# Patient Record
Sex: Female | Born: 1980 | Race: White | Hispanic: No | Marital: Married | Smoking: Current some day smoker
Health system: Southern US, Community
[De-identification: ages and names within clinical notes are randomized; demographics above are authoritative.]

## PROBLEM LIST (undated history)

## (undated) DIAGNOSIS — T7840XA Allergy, unspecified, initial encounter: Secondary | ICD-10-CM

## (undated) HISTORY — PX: WISDOM TOOTH EXTRACTION: SHX21

## (undated) HISTORY — DX: Allergy, unspecified, initial encounter: T78.40XA

---

## 2013-10-12 ENCOUNTER — Telehealth: Payer: Self-pay | Admitting: Family Medicine

## 2013-10-12 NOTE — Telephone Encounter (Signed)
This is a normal reaction to a tick bite and the are may not resolve completely for a while. May use hydrocortisone or benadryl cream for itching. Advised her to schedule an appt if she develops a rash or fever or if she develops any increased redness or warmth around the area or drainage. Patient stated understanding and agreement to plan.

## 2014-01-01 ENCOUNTER — Encounter (INDEPENDENT_AMBULATORY_CARE_PROVIDER_SITE_OTHER): Payer: Self-pay

## 2014-01-01 ENCOUNTER — Encounter: Payer: Self-pay | Admitting: Family Medicine

## 2014-01-01 ENCOUNTER — Ambulatory Visit (INDEPENDENT_AMBULATORY_CARE_PROVIDER_SITE_OTHER): Payer: BC Managed Care – PPO | Admitting: Family Medicine

## 2014-01-01 VITALS — BP 108/74 | HR 81 | Temp 99.5°F | Ht 66.5 in | Wt 147.2 lb

## 2014-01-01 DIAGNOSIS — N898 Other specified noninflammatory disorders of vagina: Secondary | ICD-10-CM

## 2014-01-01 DIAGNOSIS — R350 Frequency of micturition: Secondary | ICD-10-CM

## 2014-01-01 LAB — POCT URINALYSIS DIPSTICK
Bilirubin, UA: NEGATIVE
Glucose, UA: NEGATIVE
Ketones, UA: NEGATIVE
Leukocytes, UA: NEGATIVE
Nitrite, UA: NEGATIVE
Spec Grav, UA: >=1.03
Urobilinogen, UA: 0.2
pH, UA: 6

## 2014-01-01 LAB — POCT UA - MICROSCOPIC ONLY
Bacteria, U Microscopic: NEGATIVE
Casts, Ur, LPF, POC: NEGATIVE
Crystals, Ur, HPF, POC: NEGATIVE
Mucus, UA: NEGATIVE
WBC, Ur, HPF, POC: NEGATIVE
Yeast, UA: NEGATIVE

## 2014-01-01 MED ORDER — METRONIDAZOLE 500 MG PO TABS: 500.0000 mg | ORAL_TABLET | Freq: Three times a day (TID) | ORAL | Status: DC

## 2014-01-01 MED ORDER — CIPROFLOXACIN HCL 500 MG PO TABS: 500.0000 mg | ORAL_TABLET | Freq: Two times a day (BID) | ORAL | Status: DC

## 2014-01-01 NOTE — Progress Notes (Signed)
   Subjective:    Patient ID: Haley Allen, female    DOB: Nov 05, 1980, 33 y.o.   MRN: [id number]  HPI This 33 y.o. female presents for evaluation of vaginal discharge and dysuria.  She is requesting  STD testing.   Review of Systems C/o dysuria and vaginal discharge No chest pain, SOB, HA, dizziness, vision change, N/V, diarrhea, constipation, dysuria, urinary urgency or frequency, myalgias, arthralgias or rash.     Objective:   Physical Exam   Vital signs noted  Well developed well nourished female.  HEENT - Head atraumatic Normocephalic                Eyes - PERRLA, Conjuctiva - clear Sclera- Clear EOMI                Ears - EAC's Wnl TM's Wnl Gross Hearing WNL                Nose - Nares patent                 Throat - oropharanx wnl Respiratory - Lungs CTA bilateral Cardiac - RRR S1 and S2 without murmur GI - Abdomen soft Nontender and bowel sounds active x 4 Extremities - No edema. Neuro - Grossly intact.     Assessment & Plan:  Urinary frequency - Plan: POCT UA - Microscopic Only, POCT urinalysis dipstick, GC/Chlamydia Probe Amp, metroNIDAZOLE (FLAGYL) 500 MG tablet, ciprofloxacin (CIPRO) 500 MG tablet, HIV antibody (with reflex)  Vaginal discharge - Plan: GC/Chlamydia Probe Amp, metroNIDAZOLE (FLAGYL) 500 MG tablet, ciprofloxacin (CIPRO) 500 MG tablet, HIV antibody (with reflex)  Deatra CanterWilliam J Ara Mano FNP

## 2014-01-02 LAB — HIV ANTIBODY (ROUTINE TESTING W REFLEX)
HIV 1/O/2 Abs-Index Value: <1 (ref <1.00)
HIV-1/HIV-2 Ab: NONREACTIVE

## 2014-01-04 LAB — GC/CHLAMYDIA PROBE AMP
Chlamydia trachomatis, NAA: NEGATIVE
Neisseria gonorrhoeae by PCR: NEGATIVE

## 2014-03-29 ENCOUNTER — Ambulatory Visit (INDEPENDENT_AMBULATORY_CARE_PROVIDER_SITE_OTHER): Payer: BC Managed Care – PPO

## 2014-03-29 DIAGNOSIS — Z23 Encounter for immunization: Secondary | ICD-10-CM

## 2016-07-13 ENCOUNTER — Other Ambulatory Visit: Payer: Self-pay | Admitting: Family Medicine

## 2016-07-13 ENCOUNTER — Other Ambulatory Visit: Payer: Self-pay | Admitting: *Deleted

## 2016-07-13 MED ORDER — OSELTAMIVIR PHOSPHATE 75 MG PO CAPS: 75.0000 mg | ORAL_CAPSULE | Freq: Every day | ORAL | 0 refills | Status: DC

## 2017-06-29 NOTE — Progress Notes (Signed)
Subjective: TG:YBWLSLHTD care, URI HPI: Haley Allen is a 37 y.o. female presenting to clinic today for:  1. Cold symptoms  Patient reports sinus pressure, drainage, nasal stuffiness, sore throat that started about 2 weeks ago.  Denies cough, hemoptysis, headache, SOB, dizziness, rash, nausea, vomiting, diarrhea, fevers, chills, myalgia, sick contacts, recent travel.  Patient has used Zyrtec and Sudafed with some relief of symptoms.  Denies history of COPD or asthma.  Currently smokes tobacco.  2.  Tobacco use disorder Patient reports that she is smoked since age 18.  She is currently smoking less than half a pack per day.  She is in the action phase of quitting.  She has a goal date of July 16, 2017, she states that this is her deceased father's birthday and would like to give the gift of being smoke-free and is on her.  She denies cough, hemoptysis, shortness of breath.  3. Preventative care Patient notes that she gets her well woman exams performed with Pap at a women's clinic in [place], [place] Dr Roberto Scales.  She has an IUD in place.  She does not have menstrual cycles.  She is sexually active.  No vaginal concerns.  Denies history of abnormal Pap smears in the past.  She is up-to-date on her influenza shot.  She is unsure of tetanus status.  She has not had basic labs performed in some time.   Past Medical History:  Diagnosis Date  . Allergy    History reviewed. No pertinent surgical history. Social History   Socioeconomic History  . Marital status: Divorced    Spouse name: Not on file  . Number of children: Not on file  . Years of education: Not on file  . Highest education level: Not on file  Social Needs  . Financial resource strain: Not on file  . Food insecurity - worry: Not on file  . Food insecurity - inability: Not on file  . Transportation needs - medical: Not on file  . Transportation needs - non-medical: Not on file  Occupational History  . Not on file  Tobacco Use    . Smoking status: Current Some Day Smoker    Packs/day: 0.50    Years: 10.00    Pack years: 5.00    Types: Cigarettes    Start date: 01/01/1998  . Smokeless tobacco: Current User  Substance and Sexual Activity  . Alcohol use: Yes    Alcohol/week: 3.6 oz    Types: 6 Cans of beer per week  . Drug use: No  . Sexual activity: Yes    Birth control/protection: IUD  Other Topics Concern  . Not on file  Social History Narrative  . Not on file   No outpatient medications have been marked as taking for the 06/30/17 encounter (Office Visit) with Haley Norlander, DO.   Family History  Problem Relation Age of Onset  . Hypertension Mother   . Diabetes Mother   . Depression Mother   . Cancer Father 8       renal  . Renal cancer Father   . Diabetes Maternal Grandmother   . Leukemia Paternal Grandfather   . Prostate cancer Neg Hx   . Colon cancer Neg Hx    No Known Allergies   Health Maintenance: had flu shot.    ROS: Per HPI  Objective: Office vital signs reviewed. BP 118/85   Pulse 80   Temp 98.2 F (36.8 C) (Oral)   Ht _0  (1.702 m)  Wt 151 lb (68.5 kg)   BMI 23.65 kg/m   Physical Examination:  General: Awake, alert, well nourished, No acute distress HEENT: +maxillary sinus TTP    Neck: No masses palpated. No lymphadenopathy    Ears: Tympanic membranes intact, normal light reflex, no erythema, no bulging    Eyes: PERRLA, extraocular movement in tact, sclera white    Nose: nasal turbinates moist, clear nasal discharge    Throat: moist mucus membranes, moderate oropharyngeal erythema, grade 2 tonsils with no tonsillar exudate.  Airway is patent Cardio: regular rate and rhythm, S1S2 heard, no murmurs appreciated Pulm: clear to auscultation bilaterally, no wheezes, rhonchi or rales; normal work of breathing on room air MSK: Normal gait and station Psych: Mood stable, speech normal, affect appropriate, pleasant  Assessment/ Plan: 37 y.o. female   1. Acute  non-recurrent maxillary sinusitis Given duration of symptoms, will treat empirically for a bacterial sinusitis.  Augmentin 875 p.o. twice daily for 10 days prescribed.  Start Flonase.  Continue Zyrtec.  May discontinue Sudafed, particularly with reports of insomnia.  Home care instructions were reviewed and a handout was provided to the patient.  Return as needed.  2. Establishing care with new doctor, encounter for We will attempt to obtain Pap smear records from OB/GYN in [place].  3. Sore throat Rapid strep was negative. - Rapid Strep Screen (Not at [facility])  4. Current every day smoker Currently in the action phase of smoking cessation.  5. Screening for HIV without presence of risk factors - HIV antibody (with reflex)  6. Screening for metabolic disorder - [id number]  7. Screening for lipid disorders - Lipid Panel  Meds ordered this encounter  Medications  . amoxicillin-clavulanate (AUGMENTIN) 875-125 MG tablet    Sig: Take 1 tablet by mouth 2 (two) times daily.    Dispense:  20 tablet    Refill:  0  . fluticasone (FLONASE) 50 MCG/ACT nasal spray    Sig: Place 2 sprays into both nostrils daily.    Dispense:  16 g    Refill:  [place], [place] [phone]

## 2017-06-30 ENCOUNTER — Encounter: Payer: Self-pay | Admitting: Family Medicine

## 2017-06-30 ENCOUNTER — Ambulatory Visit: Payer: BLUE CROSS/BLUE SHIELD | Admitting: Family Medicine

## 2017-06-30 VITALS — BP 118/85 | HR 80 | Temp 98.2°F | Ht 67.0 in | Wt 151.0 lb

## 2017-06-30 DIAGNOSIS — Z7689 Persons encountering health services in other specified circumstances: Secondary | ICD-10-CM | POA: Diagnosis not present

## 2017-06-30 DIAGNOSIS — J029 Acute pharyngitis, unspecified: Secondary | ICD-10-CM | POA: Diagnosis not present

## 2017-06-30 DIAGNOSIS — Z114 Encounter for screening for human immunodeficiency virus [HIV]: Secondary | ICD-10-CM

## 2017-06-30 DIAGNOSIS — Z13220 Encounter for screening for lipoid disorders: Secondary | ICD-10-CM

## 2017-06-30 DIAGNOSIS — Z87891 Personal history of nicotine dependence: Secondary | ICD-10-CM | POA: Insufficient documentation

## 2017-06-30 DIAGNOSIS — F17200 Nicotine dependence, unspecified, uncomplicated: Secondary | ICD-10-CM | POA: Insufficient documentation

## 2017-06-30 DIAGNOSIS — J0100 Acute maxillary sinusitis, unspecified: Secondary | ICD-10-CM

## 2017-06-30 DIAGNOSIS — Z13228 Encounter for screening for other metabolic disorders: Secondary | ICD-10-CM

## 2017-06-30 LAB — RAPID STREP SCREEN (MED CTR MEBANE ONLY): Strep Gp A Ag, IA W/Reflex: NEGATIVE

## 2017-06-30 LAB — CULTURE, GROUP A STREP

## 2017-06-30 MED ORDER — AMOXICILLIN-POT CLAVULANATE 875-125 MG PO TABS
1.0000 | ORAL_TABLET | Freq: Two times a day (BID) | ORAL | 0 refills | Status: DC
Start: 2017-06-30 — End: 2018-12-12

## 2017-06-30 MED ORDER — FLUTICASONE PROPIONATE 50 MCG/ACT NA SUSP: 2.0000 | Freq: Every day | NASAL | 6 refills | Status: DC

## 2017-06-30 NOTE — Assessment & Plan Note (Addendum)
She has a ~15 pack year history.  Action phase of smoking cessation.  Goal date for discontinuation is 07/16/2017.  We will continue to follow and support patient as able.

## 2017-06-30 NOTE — Patient Instructions (Addendum)
You had labs performed today.  You will be contacted with the results of the labs once they are available, usually in the next 3 days for routine lab work.  Follow up as needed.  I value your feedback and appreciate you entrusting [place] with your care.  If you get a survey, I would appreciate your taking the time to let us know what your experience was like.  - Get plenty of rest and drink plenty of fluids. - Try to breathe moist air. Use a cold mist humidifier. - Consume warm fluids (soup or tea) to provide relief for a stuffy nose and to loosen phlegm. - For nasal stuffiness, try saline nasal spray or a Neti Pot. Afrin nasal spray can also be used but this product should not be used longer than 3 days or it will cause rebound nasal stuffiness (worsening nasal congestion). - For sore throat pain relief: suck on throat lozenges, hard candy or popsicles; gargle with warm salt water (1/4 tsp. salt per 8 oz. of water); and eat soft, bland foods. - Eat a well-balanced diet. If you cannot, ensure you are getting enough nutrients by taking a daily multivitamin. - Avoid dairy products, as they can thicken phlegm. - Avoid alcohol, as it impairs your body's immune system.  CONTACT YOUR DOCTOR IF YOU EXPERIENCE ANY OF THE FOLLOWING: - High fever - Ear pain - Sinus-type headache - Unusually severe cold symptoms - Cough that gets worse while other cold symptoms improve - Flare up of any chronic lung problem, such as asthma - Your symptoms persist longer than 2 weeks   Tobacco Use Disorder Tobacco use disorder (TUD) is a mental disorder. It is the long-term use of tobacco in spite of related health problems or difficulty with normal life activities. Tobacco is most commonly smoked as cigarettes and less commonly as cigars or pipes. Smokeless chewing tobacco and snuff are also popular. People with TUD get a feeling of extreme pleasure (euphoria) from using tobacco and have a desire to use it again and  again. Repeated use of tobacco can cause problems. The addictive effects of tobacco are due mainly tothe ingredient nicotine. Nicotine also causes a rush of adrenaline (epinephrine) in the body. This leads to increased blood pressure, heart rate, and breathing rate. These changes may cause problems for people with high blood pressure, weak hearts, or lung disease. High doses of nicotine in children and pets can lead to seizures and death. Tobacco contains a number of other unsafe chemicals. These chemicals are especially harmful when inhaled as smoke and can damage almost every organ in the body. Smokers live shorter lives than nonsmokers and are at risk of dying from a number of diseases and cancers. Tobacco smoke can also cause health problems for nonsmokers (due to inhaling secondhand smoke). Smoking is also a fire hazard. TUD usually starts in the late teenage years and is most common in young adults between the ages of 30 and 25 years. People who start smoking earlier in life are more likely to continue smoking as adults. TUD is somewhat more common in men than women. People with TUD are at higher risk for using alcohol and other drugs of abuse. What increases the risk? Risk factors for TUD include:  Having family members with the disorder.  Being around people who use tobacco.  Having an existing mental health issue such as schizophrenia, depression, bipolar disorder, ADHD, or posttraumatic stress disorder (PTSD).  What are the signs or symptoms? People with  tobacco use disorder have two or more of the following signs and symptoms within 12 months:  Use of more tobacco over a longer period than intended.  Not able to cut down or control tobacco use.  A lot of time spent obtaining or using tobacco.  Strong desire or urge to use tobacco (craving). Cravings may last for 6 months or longer after quitting.  Use of tobacco even when use leads to major problems at work, school, or  home.  Use of tobacco even when use leads to relationship problems.  Giving up or cutting down on important life activities because of tobacco use.  Repeatedly using tobacco in situations where it puts you or others in physical danger, like smoking in bed.  Use of tobacco even when it is known that a physical or mental problem is likely related to tobacco use. ? Physical problems are numerous and may include chronic bronchitis, emphysema, lung and other cancers, gum disease, high blood pressure, heart disease, and stroke. ? Mental problems caused by tobacco may include difficulty sleeping and anxiety.  Need to use greater amounts of tobacco to get the same effect. This means you have developed a tolerance.  Withdrawal symptoms as a result of stopping or rapidly cutting back use. These symptoms may last a month or more after quitting and include the following: ? Depressed, anxious, or irritable mood. ? Difficulty concentrating. ? Increased appetite. ? Restlessness or trouble sleeping. ? Use of tobacco to avoid withdrawal symptoms.  How is this diagnosed? Tobacco use disorder is diagnosed by your health care provider. A diagnosis may be made by:  Your health care provider asking questions about your tobacco use and any problems it may be causing.  A physical exam.  Lab tests.  You may be referred to a mental health professional or addiction specialist.  The severity of tobacco use disorder depends on the number of signs and symptoms you have:  Mild-Two or three symptoms.  Moderate-Four or five symptoms.  Severe-Six or more symptoms.  How is this treated? Many people with tobacco use disorder are unable to quit on their own and need help. Treatment options include the following:  Nicotine replacement therapy (NRT). NRT provides nicotine without the other harmful chemicals in tobacco. NRT gradually lowers the dosage of nicotine in the body and reduces withdrawal symptoms. NRT  is available in over-the-counter forms (gum, lozenges, and skin patches) as well as prescription forms (mouth inhaler and nasal spray).  Medicines.This may include: ? Antidepressant medicine that may reduce nicotine cravings. ? A medicine that acts on nicotine receptors in the brain to reduce cravings and withdrawal symptoms. It may also block the effects of tobacco in people with TUD who relapse.  Counseling or talk therapy. A form of talk therapy called behavioral therapy is commonly used to treat people with TUD. Behavioral therapy looks at triggers for tobacco use, how to avoid them, and how to cope with cravings. It is most effective in person or by phone but is also available in self-help forms (books and Internet websites).  Support groups. These provide emotional support, advice, and guidance for quitting tobacco.  The most effective treatment for TUD is usually a combination of medicine, talk therapy, and support groups. Follow these instructions at home:  Keep all follow-up visits as directed by your health care provider. This is important.  Take medicines only as directed by your health care provider.  Check with your health care provider before starting new prescription or  over-the-counter medicines. Contact a health care provider if:  You are not able to take your medicines as prescribed.  Treatment is not helping your TUD and your symptoms get worse. Get help right away if:  You have serious thoughts about hurting yourself or others.  You have trouble breathing, chest pain, sudden weakness, or sudden numbness in part of your body. This information is not intended to replace advice given to you by your health care provider. Make sure you discuss any questions you have with your health care provider. Document Released: 02/11/2004 Document Revised: 02/08/2016 Document Reviewed: 08/03/2013 Elsevier Interactive Patient Education  [employer].

## 2017-07-01 LAB — CMP14+EGFR
A/G RATIO: 1.8 (ref 1.2–2.2)
ALT: 11 IU/L (ref 0–32)
AST: 17 IU/L (ref 0–40)
Albumin: 4.6 g/dL (ref 3.5–5.5)
Alkaline Phosphatase: 53 IU/L (ref 39–117)
BILIRUBIN TOTAL: 0.4 mg/dL (ref 0.0–1.2)
BUN/Creatinine Ratio: 10 (ref 9–23)
BUN: 8 mg/dL (ref 6–20)
CALCIUM: 9.2 mg/dL (ref 8.7–10.2)
CHLORIDE: 103 mmol/L (ref 96–106)
CO2: 24 mmol/L (ref 20–29)
Creatinine, Ser: 0.77 mg/dL (ref 0.57–1.00)
GFR, EST AFRICAN AMERICAN: 113 mL/min/{1.73_m2} (ref >59)
GFR, EST NON AFRICAN AMERICAN: 98 mL/min/{1.73_m2} (ref >59)
GLOBULIN, TOTAL: 2.5 g/dL (ref 1.5–4.5)
Glucose: 81 mg/dL (ref 65–99)
POTASSIUM: 4.3 mmol/L (ref 3.5–5.2)
Sodium: 142 mmol/L (ref 134–144)
TOTAL PROTEIN: 7.1 g/dL (ref 6.0–8.5)

## 2017-07-01 LAB — LIPID PANEL
CHOL/HDL RATIO: 3.4 ratio (ref 0.0–4.4)
Cholesterol, Total: 182 mg/dL (ref 100–199)
HDL: 53 mg/dL (ref >39)
LDL Calculated: 114 mg/dL — ABNORMAL HIGH (ref 0–99)
Triglycerides: 73 mg/dL (ref 0–149)
VLDL Cholesterol Cal: 15 mg/dL (ref 5–40)

## 2017-07-01 LAB — HIV ANTIBODY (ROUTINE TESTING W REFLEX): HIV Screen 4th Generation wRfx: NONREACTIVE

## 2018-12-07 DIAGNOSIS — H5203 Hypermetropia, bilateral: Secondary | ICD-10-CM | POA: Diagnosis not present

## 2018-12-11 DIAGNOSIS — H5213 Myopia, bilateral: Secondary | ICD-10-CM | POA: Diagnosis not present

## 2018-12-12 ENCOUNTER — Encounter: Payer: Self-pay | Admitting: Nurse Practitioner

## 2018-12-12 ENCOUNTER — Other Ambulatory Visit: Payer: Self-pay

## 2018-12-12 ENCOUNTER — Ambulatory Visit: Payer: BLUE CROSS/BLUE SHIELD | Admitting: Nurse Practitioner

## 2018-12-12 VITALS — BP 122/77 | HR 66 | Temp 97.3°F | Ht 67.0 in | Wt 165.0 lb

## 2018-12-12 DIAGNOSIS — L989 Disorder of the skin and subcutaneous tissue, unspecified: Secondary | ICD-10-CM

## 2018-12-12 DIAGNOSIS — M25562 Pain in left knee: Secondary | ICD-10-CM

## 2018-12-12 MED ORDER — NAPROXEN 500 MG PO TABS: 500.0000 mg | ORAL_TABLET | Freq: Two times a day (BID) | ORAL | 1 refills | Status: DC

## 2018-12-12 NOTE — Patient Instructions (Signed)
Sunburn, Adult  Sunburn is damage to the skin that is caused by being in the sun too much. Getting too much sun over and over can cause wrinkles and dark spots on the skin (sun spots). It can also increase your chance of getting skin cancer. Follow these instructions at home: Medicines  Take or apply over-the-counter and prescription medicines only as told by your doctor.  If you were prescribed an antibiotic medicine, use it as told by your doctor. Do not stop using the antibiotic even if your condition improves. General instructions   Avoid being in the sun. Wear clothing that covers your sunburn.  Do not put ice on your sunburn. Try taking a cool bath or putting a cool, wet cloth (cool compress) on your skin. This may help with pain.  Drink enough fluid to keep your pee (urine) pale yellow.  Try putting aloe vera or a moisturizer that has soy in it on your sunburn. This may help your pain. Do not do this if you have blisters.  Do not break any blisters if you have them.  Keep all follow-up visits as told by your doctor. This is important. Preventing sunburn To keep from getting sunburned:  Try to stay out of the sun between 10 a.m. and 4 p.m. The sun is strongest during that time.  Put on sunscreen 30 minutes or more before you go out in the sun.  Use a sunscreen with an SPF of 15 or higher. If you will be in the sun for a long time, think about using an SPF of 30 or higher. Use a sunscreen that protects against all of the sun's rays (broad-spectrum) and is water-resistant.  Put sunscreen on again: ? About every 2 hours while you are in the sun. ? More often if you are sweating a lot while you are in the sun. ? After you get wet from swimming or playing in water. ? Wear long sleeves, a hat, and sunglasses when you are outside. ? Talk with your doctor about medicines, herbs, and foods that can make you more sensitive to light. Avoid these, if possible. ? Do not use tanning  beds.  Contact a doctor if:  You have a fever or chills.  Your symptoms do not get better with treatment.  Medicine does not help your pain.  Your burn gets more painful or swollen.  You have open blisters on your skin. Get help right away if:  You start to throw up (vomit).  You start to have watery poop (diarrhea).  You feel dizzy.  You pass out.  You have a very bad headache or you feel confused.  You have very bad blisters.  You have pus or fluid coming from the blisters. Summary  Sunburn is damage to the skin that is caused by being in the sun too much.  Do not put ice on your sunburn. Try taking a cool bath or putting a cool, wet cloth (cool compress) on your skin. This may help with pain.  Do not break any blisters if you have them.  Put on sunscreen 30 minutes or more before you go out in the sun. This can help you to not get sunburned. This information is not intended to replace advice given to you by your health care provider. Make sure you discuss any questions you have with your health care provider. Document Released: 02/17/2011 Document Revised: 09/02/2016 Document Reviewed: 09/02/2016 Elsevier Interactive Patient Education  2019 [employer] Inc.  

## 2018-12-12 NOTE — Progress Notes (Signed)
   Subjective:    Patient ID: Haley Allen, female    DOB: 1981-04-04, 38 y.o.   MRN: [id number]   Chief Complaint: Knee Pain (Swelling) and spot on chest   HPI Patient  Come sin today C/O: - left knee pain. She injured it  A couple of years ago and then she fell on her knee 1 week ago. Has achy pain on medial side and walking makes pain worse. rates pain 1-6/10. No edema or swelling. - she has a lesion on anterior chest wall that gets bigger in the sun. Has been there for over a year.    Review of Systems  Constitutional: Negative for activity change and appetite change.  HENT: Negative.   Eyes: Negative for pain.  Respiratory: Negative for shortness of breath.   Cardiovascular: Negative for chest pain, palpitations and leg swelling.  Gastrointestinal: Negative for abdominal pain.  Endocrine: Negative for polydipsia.  Genitourinary: Negative.   Musculoskeletal: Positive for arthralgias (left knee).  Skin: Negative for rash.       Skin lesion ant chest wall  Neurological: Negative for dizziness, weakness and headaches.  Hematological: Does not bruise/bleed easily.  Psychiatric/Behavioral: Negative.   All other systems reviewed and are negative.      Objective:   Physical Exam Vitals signs and nursing note reviewed.  Constitutional:      Appearance: Normal appearance.  Cardiovascular:     Rate and Rhythm: Normal rate and regular rhythm.     Heart sounds: Normal heart sounds.  Pulmonary:     Breath sounds: Normal breath sounds.  Musculoskeletal: Normal range of motion.     Comments: FROM of left knee without pain No effusion No patella tenderness All ligaments intact  Skin:    General: Skin is warm and dry.     Findings: Lesion (2cm slightly raised dry lesion on mid anterior chest wall.) present.  Neurological:     Mental Status: She is alert.    BP 122/77   Pulse 66   Temp (!) 97.3 F (36.3 C) (Oral)   Ht 5\' 7"  (1.702 m)   Wt 165 lb (74.8 kg)   BMI 25.84  kg/m         Assessment & Plan:  [name] in today with chief complaint of Knee Pain (Swelling) and spot on chest   1. Acute pain of left knee Rest Ice BID  compresion sleeve when up walking Elevate when sitting - naproxen (NAPROSYN) 500 MG tablet; Take 1 tablet (500 mg total) by mouth 2 (two) times daily with a meal.  Dispense: 60 tablet; Refill: 1  2. Skin lesion of chest wall Do not pick or scratch Wear sun screen with SPF 30 when out in sun - Ambulatory referral to Dermatology  [place], [place]

## 2018-12-21 ENCOUNTER — Encounter: Payer: Self-pay | Admitting: Nurse Practitioner

## 2018-12-21 ENCOUNTER — Ambulatory Visit: Payer: Medicaid Other | Admitting: Nurse Practitioner

## 2018-12-21 ENCOUNTER — Other Ambulatory Visit: Payer: Self-pay

## 2018-12-21 VITALS — BP 126/85 | HR 72 | Temp 97.5°F | Ht 67.0 in | Wt 165.0 lb

## 2018-12-21 DIAGNOSIS — M25562 Pain in left knee: Secondary | ICD-10-CM

## 2018-12-21 MED ORDER — PREDNISONE 10 MG (21) PO TBPK: ORAL_TABLET | ORAL | 0 refills | Status: DC

## 2018-12-21 NOTE — Progress Notes (Signed)
   Subjective:    Patient ID: Haley Allen, female    DOB: 13-Jun-1981, 38 y.o.   MRN: [id number]   Chief Complaint: Still having left knee pain   HPI Patient comes in today c/o left knee pain. She saw me on 12/12/18 and was given naprosyn  It has not helped at all. She says that knee aches all the time. She wears elastic brace which helps some. Swells when she s up on it a lot.  She has been using ice bid.   Review of Systems  Constitutional: Negative.   Respiratory: Negative.   Cardiovascular: Negative.   Genitourinary: Negative.   Musculoskeletal: Positive for arthralgias (left knee).  Psychiatric/Behavioral: Negative.   All other systems reviewed and are negative.      Objective:   Physical Exam Vitals signs and nursing note reviewed.  Constitutional:      Appearance: Normal appearance.  Cardiovascular:     Rate and Rhythm: Normal rate and regular rhythm.     Heart sounds: Normal heart sounds.  Pulmonary:     Effort: Pulmonary effort is normal.     Breath sounds: Normal breath sounds.  Musculoskeletal:        General: Swelling (mild left knee effusion) present.     Comments: No patella tenderness FROM with pain on full extension and full flexion  Skin:    General: Skin is warm and dry.  Neurological:     General: No focal deficit present.     Mental Status: She is alert and oriented to person, place, and time.  Psychiatric:        Mood and Affect: Mood normal.        Behavior: Behavior normal.    BP 126/85   Pulse 72   Temp (!) 97.5 F (36.4 C) (Oral)   Ht 5\' 7"  (1.702 m)   Wt 165 lb (74.8 kg)   BMI 25.84 kg/m         Assessment & Plan:  Haley Allen in today with chief complaint of Still having left knee pain   1. Acute pain of left knee Rest Ice bid Compression wrap elevate when sitting - predniSONE (STERAPRED UNI-PAK 21 TAB) 10 MG (21) TBPK tablet; As directed x 6 days  Dispense: 21 tablet; Refill: 0  [name] Hassell Done, FNP

## 2018-12-21 NOTE — Patient Instructions (Signed)

## 2018-12-27 ENCOUNTER — Telehealth: Payer: Self-pay

## 2018-12-27 DIAGNOSIS — M25562 Pain in left knee: Secondary | ICD-10-CM

## 2018-12-27 NOTE — Telephone Encounter (Signed)
Patient is still having pain and swelling in her knee. Do you want to refer to ortho? If so she prefers [facility]

## 2018-12-28 NOTE — Telephone Encounter (Signed)
Ortho referral done

## 2018-12-28 NOTE — Telephone Encounter (Signed)
Patient aware.

## 2019-01-04 ENCOUNTER — Ambulatory Visit (INDEPENDENT_AMBULATORY_CARE_PROVIDER_SITE_OTHER): Payer: Medicaid Other | Admitting: Orthopaedic Surgery

## 2019-01-04 ENCOUNTER — Ambulatory Visit (INDEPENDENT_AMBULATORY_CARE_PROVIDER_SITE_OTHER): Payer: Medicaid Other

## 2019-01-04 ENCOUNTER — Encounter: Payer: Self-pay | Admitting: Orthopaedic Surgery

## 2019-01-04 ENCOUNTER — Other Ambulatory Visit: Payer: Self-pay

## 2019-01-04 VITALS — BP 110/68 | HR 60 | Ht 67.0 in | Wt 165.0 lb

## 2019-01-04 DIAGNOSIS — M25562 Pain in left knee: Secondary | ICD-10-CM

## 2019-01-04 DIAGNOSIS — M659 Synovitis and tenosynovitis, unspecified: Secondary | ICD-10-CM | POA: Diagnosis not present

## 2019-01-04 DIAGNOSIS — M65962 Unspecified synovitis and tenosynovitis, left lower leg: Secondary | ICD-10-CM | POA: Insufficient documentation

## 2019-01-04 MED ORDER — METHYLPREDNISOLONE ACETATE 40 MG/ML IJ SUSP
40.0000 mg | INTRAMUSCULAR | Status: AC | PRN
  Administered 2019-01-04: 14:00:00 40 mg via INTRA_ARTICULAR

## 2019-01-04 MED ORDER — BUPIVACAINE HCL 0.5 % IJ SOLN
3.0000 mL | INTRAMUSCULAR | Status: AC | PRN
  Administered 2019-01-04: 14:00:00 3 mL via INTRA_ARTICULAR

## 2019-01-04 MED ORDER — LIDOCAINE HCL 1 % IJ SOLN
0.5000 mL | INTRAMUSCULAR | Status: AC | PRN
  Administered 2019-01-04: 14:00:00 .5 mL

## 2019-01-04 NOTE — Progress Notes (Signed)
Office Visit Note   Patient: Haley Allen           Date of Birth: [birth year]           MRN: [id number] Visit Date: 01/04/2019              Requested by: Chevis Pretty, [place],  [place] [address] PCP: [name], DO   Assessment & Plan: Visit Diagnoses:  1. Acute pain of left knee   2. Synovitis of left knee     Plan: Intra-articular injection performed if patient does not get relief with injection will consider diagnostic MRI imaging.  Follow-Up Instructions: Return in about 3 weeks (around 01/25/2019).   Orders:  Orders Placed This Encounter  Procedures   Large Joint Inj: L knee   XR KNEE 3 VIEW LEFT   No orders of the defined types were placed in this encounter.     Procedures: Large Joint Inj: L knee on 01/04/2019 2:27 PM Indications: joint swelling and pain Details: 22 G 1.5 in needle, anterolateral approach  Arthrogram: No  Medications: 0.5 mL lidocaine 1 %; 3 mL bupivacaine 0.5 %; 40 mg methylPREDNISolone acetate 40 MG/ML Outcome: tolerated well, no immediate complications Procedure, treatment alternatives, risks and benefits explained, specific risks discussed. Consent was given by the patient. Immediately prior to procedure a time out was called to verify the correct patient, procedure, equipment, support staff and site/side marked as required. Patient was prepped and draped in the usual sterile fashion.       Clinical Data: No additional findings.   Subjective: Chief Complaint  Patient presents with   Left Knee - Pain    HPI 38 year old female with painful left knee present x2 to 4 months.  For last 2 months she has been walking on her toe since when she has heel strike she has increased knee pain she has a little bit more pain medially than laterally.  She is been on Naprosyn also steroid pack without relief she is used a knee sleeve.  No history of injury no history of rheumatologic conditions.  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  No previous orthopedic surgical procedures.  Patient's been active and healthy other than the current knee problem.  Review of Systems positive childbirth 76 year old son current knee problem otherwise 14 point systems negative.  Patient is half pack per day smoker x15 years.   Objective: Vital Signs: BP 110/68    Pulse 60    Ht 5\' 7"  (1.702 m)    Wt 165 lb (74.8 kg)    BMI 25.84 kg/m   Physical Exam Constitutional:      Appearance: She is well-developed.  HENT:     Head: Normocephalic.     Right Ear: External ear normal.     Left Ear: External ear normal.  Eyes:     Pupils: Pupils are equal, round, and reactive to light.  Neck:     Thyroid: No thyromegaly.     Trachea: No tracheal deviation.  Cardiovascular:     Rate and Rhythm: Normal rate.  Pulmonary:     Effort: Pulmonary effort is normal.  Abdominal:     Palpations: Abdomen is soft.  Skin:    General: Skin is warm and dry.  Neurological:     Mental Status: She is alert and oriented to person, place, and time.  Psychiatric:        Behavior: Behavior normal.  Ortho Exam Patient has palpable exostosis corresponding with x-ray just below the Pez bursa which is symmetrical slightly more prominent on the right knee than left knee non-tender on the right knee but is slightly tender over the left.  Mildly prominent medial plica left knee only.  Patella tracking is normal no crepitus with extension negative apprehension test ACL PCL exam is normal she has some pain with hyperextension at the medial joint line.  Medial joint line tenderness close to the medial collateral ligament and posteriorly.  Lateral collateral stable.  Negative pivot shift negative anterior posterior drawer normal hip range of motion negative popliteal compression test.  Distal pulses are 2+ and intact. Specialty Comments:  No specialty comments available.  Imaging: Xr Knee 3 View  Left  Result Date: 01/04/2019 Standing AP both knees lateral left knee and sunrise patellar x-ray is obtained and reviewed negative for acute changes.  Likely small medial exostosis noted right and left knee. Impression: Left knee x-rays negative for acute changes no significant degenerative changes present.    PMFS History: Patient Active Problem List   Diagnosis Date Noted   Synovitis of left knee 01/04/2019   Current every day smoker 06/30/2017   Past Medical History:  Diagnosis Date   Allergy     Family History  Problem Relation Age of Onset   Hypertension Mother    Diabetes Mother    Depression Mother    Cancer Father [id number]       renal   Renal cancer Father    Diabetes Maternal Grandmother    Leukemia Paternal Grandfather    Prostate cancer Neg Hx    Colon cancer Neg Hx     History reviewed. No pertinent surgical history. Social History   Occupational History   Not on file  Tobacco Use   Smoking status: Current Some Day Smoker    Packs/day: 0.50    Years: 10.00    Pack years: 5.00    Types: Cigarettes    Start date: 01/01/1998   Smokeless tobacco: Current User  Substance and Sexual Activity   Alcohol use: Yes    Alcohol/week: 6.0 standard drinks    Types: 6 Cans of beer per week   Drug use: No   Sexual activity: Yes    Birth control/protection: I.U.D.

## 2019-02-01 ENCOUNTER — Encounter: Payer: Self-pay | Admitting: Orthopaedic Surgery

## 2019-02-01 ENCOUNTER — Other Ambulatory Visit: Payer: Self-pay

## 2019-02-01 ENCOUNTER — Ambulatory Visit (INDEPENDENT_AMBULATORY_CARE_PROVIDER_SITE_OTHER): Payer: Medicaid Other | Admitting: Orthopaedic Surgery

## 2019-02-01 VITALS — BP 130/98 | HR 68 | Ht 67.0 in | Wt 165.0 lb

## 2019-02-01 DIAGNOSIS — M659 Synovitis and tenosynovitis, unspecified: Secondary | ICD-10-CM | POA: Diagnosis not present

## 2019-02-01 NOTE — Progress Notes (Signed)
Office Visit Note   Patient: Haley Allen           Date of Birth: 06/22/1980           MRN: [id number] Visit Date: 02/01/2019              Requested by: [name], DO [place],  [place] [address] PCP: [name], DO   Assessment & Plan: Visit Diagnoses:  1. Synovitis of left knee     Plan: Patient is having persistent medial joint line pain abnormal gait now for 4 months not responsive to anti-inflammatories exercise program and intra-articular cortisone injection.  I recommend proceeding with an MRI scan to rule out a medial meniscal tear since she is not been able to walk in a normal fashion is having problems with work activities.  Office follow-up after MRI scan for review.  Follow-Up Instructions: No follow-ups on file.   Orders:  No orders of the defined types were placed in this encounter.  No orders of the defined types were placed in this encounter.     Procedures: No procedures performed   Clinical Data: No additional findings.   Subjective: Chief Complaint  Patient presents with  . Left Knee - Pain    HPI 38 year old female returns with ongoing problems with left knee pain.  Previous intra-articular injection 01/04/2019 gave her some improvement.  Prior to the injection she can walk on her left toe only with medial joint line pain adjacent the medial collateral ligament.  Now she has partial heel strike but then pulls the heel up rapidly during stance phase and is still walking on her toes some but not as severe.  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  She is not fallen but still has to be careful when she is walking and she still has persistent medial joint line pain.  She is not had persistent symptoms for 5 months.  Toe walking for  the last 3 months.  Previous plain radiographs were negative for acute changes and those degenerative changes.  Review of Systems 14 point update unchanged from 01/04/2019 she has a 40 year old son.  Half pack per day smoker.  No other surgeries.  14 point systems otherwise negative is obtains HPI.   Objective: Vital Signs: BP (!) 130/98   Pulse 68   Ht 5\' 7"  (1.702 m)   Wt 165 lb (74.8 kg)   BMI 25.84 kg/m   Physical Exam Constitutional:      Appearance: She is well-developed.  HENT:     Head: Normocephalic.     Right Ear: External ear normal.     Left Ear: External ear normal.  Eyes:     Pupils: Pupils are equal, round, and reactive to light.  Neck:     Thyroid: No thyromegaly.     Trachea: No tracheal deviation.  Cardiovascular:     Rate and Rhythm: Normal rate.  Pulmonary:     Effort: Pulmonary effort is normal.  Abdominal:     Palpations: Abdomen is soft.  Skin:    General: Skin is warm and dry.  Neurological:     Mental Status: She is alert and oriented to person, place, and time.  Psychiatric:  Behavior: Behavior normal.     Ortho Exam negative logroll to the hips.  She has medial joint line tenderness just anterior posterior to the medial collateral ligament.  No medial joint line opening with valgus.  She has pain medially with hyperextension.  Negative anterior drawer negative Lockman. Specialty Comments:  No specialty comments available.  Imaging: No results found.   PMFS History: Patient Active Problem List   Diagnosis Date Noted  . Synovitis of left knee 01/04/2019  . Current every day smoker 06/30/2017   Past Medical History:  Diagnosis Date  . Allergy     Family History  Problem Relation Age of Onset  . Hypertension Mother   . Diabetes Mother   . Depression Mother   . Cancer Father [phone]       renal  . Renal cancer Father   . Diabetes Maternal Grandmother   . Leukemia Paternal Grandfather   . Prostate cancer Neg Hx   . Colon cancer  Neg Hx     History reviewed. No pertinent surgical history. Social History   Occupational History  . Not on file  Tobacco Use  . Smoking status: Current Some Day Smoker    Packs/day: 0.50    Years: 10.00    Pack years: 5.00    Types: Cigarettes    Start date: 01/01/1998  . Smokeless tobacco: Current User  Substance and Sexual Activity  . Alcohol use: Yes    Alcohol/week: 6.0 standard drinks    Types: 6 Cans of beer per week  . Drug use: No  . Sexual activity: Yes    Birth control/protection: I.U.D.

## 2019-02-07 DIAGNOSIS — M7122 Synovial cyst of popliteal space [Baker], left knee: Secondary | ICD-10-CM | POA: Diagnosis not present

## 2019-02-07 DIAGNOSIS — M25562 Pain in left knee: Secondary | ICD-10-CM | POA: Diagnosis not present

## 2019-02-07 DIAGNOSIS — S83242A Other tear of medial meniscus, current injury, left knee, initial encounter: Secondary | ICD-10-CM | POA: Diagnosis not present

## 2019-02-08 ENCOUNTER — Ambulatory Visit (INDEPENDENT_AMBULATORY_CARE_PROVIDER_SITE_OTHER): Payer: Medicaid Other | Admitting: Orthopaedic Surgery

## 2019-02-08 ENCOUNTER — Other Ambulatory Visit: Payer: Self-pay

## 2019-02-08 VITALS — BP 118/77 | HR 77 | Ht 68.0 in | Wt 160.0 lb

## 2019-02-08 DIAGNOSIS — M6752 Plica syndrome, left knee: Secondary | ICD-10-CM | POA: Diagnosis not present

## 2019-02-08 DIAGNOSIS — M659 Synovitis and tenosynovitis, unspecified: Secondary | ICD-10-CM | POA: Diagnosis not present

## 2019-02-08 NOTE — Progress Notes (Signed)
Office Visit Note   Patient: Haley Allen           Date of Birth: 05-14-1981           MRN: [id number] Visit Date: 02/08/2019              Requested by: Raliegh IpGottschalk, Ashly M, DO [address] [place] [id number] PCP: Raliegh IpGottschalk, Ashly M, DO   Assessment & Plan: Visit Diagnoses:  1. Synovitis of left knee   2. Plica syndrome of left knee     Plan: We injected the medial plica.  After injection with practice and encouragement she was able to heel strike but still has a tendency to avoid significant weightbearing on her heel with gait on the left side.  She will try to work on her normal gait sequence and I plan to recheck her again in 4 weeks.  MRI scan images were reviewed with patient and results were discussed.  She can continue the Naprosyn 5 1 mg twice daily with food.  Follow-Up Instructions: Return in about 4 weeks (around 03/08/2019).   Orders:  Orders Placed This Encounter  Procedures  . Large Joint Inj: L knee   No orders of the defined types were placed in this encounter.     Procedures: Large Joint Inj: L knee on 02/12/2019 10:32 AM Indications: joint swelling and pain Details: 22 G 1.5 in needle, anteromedial approach  Arthrogram: No  Medications: 0.5 mL lidocaine 1 %; 3 mL bupivacaine 0.5 %; 40 mg methylPREDNISolone acetate 40 MG/ML Outcome: tolerated well, no immediate complications Procedure, treatment alternatives, risks and benefits explained, specific risks discussed. Consent was given by the patient. Immediately prior to procedure a time out was called to verify the correct patient, procedure, equipment, support staff and site/side marked as required. Patient was prepped and draped in the usual sterile fashion.       Clinical Data: No additional findings.   Subjective: Chief Complaint  Patient presents with  . Left Knee - Follow-up    MRI Left Knee Review    HPI 38 year old female returns with a several month history of continued left  knee pain and walking on her toe avoiding heel strike with primarily medial joint line pain and anterior knee pain.  She taken anti-inflammatories without relief and is been through an exercise program.  Intra-articular injection 01/04/2019 gave her some improvement but she still has altered gait and has to be careful when she walks.  She been walking on her toe without heel strike for about 3 months and continues to use a knee sleeve.  Review of Systems 14 point system update unchanged from 02/01/2019 office visit other than as mentioned in HPI.   Objective: Vital Signs: BP 118/77   Pulse 77   Ht 5\' 8"  (1.727 m)   Wt 160 lb (72.6 kg)   BMI 24.33 kg/m   Physical Exam Constitutional:      Appearance: She is well-developed.  HENT:     Head: Normocephalic.     Right Ear: External ear normal.     Left Ear: External ear normal.  Eyes:     Pupils: Pupils are equal, round, and reactive to light.  Neck:     Thyroid: No thyromegaly.     Trachea: No tracheal deviation.  Cardiovascular:     Rate and Rhythm: Normal rate.  Pulmonary:     Effort: Pulmonary effort is normal.  Abdominal:     Palpations: Abdomen is soft.  Skin:    General: Skin is warm and dry.  Neurological:     Mental Status: She is alert and oriented to person, place, and time.  Psychiatric:        Behavior: Behavior normal.     Ortho Exam patient has normal patellar tracking.  There is tenderness medially over a palpable plica which does not appear to clearly thickened but is tender.  Opposite right knee has tiny Pleak and nontender.  Patient has pain over the plica with flexion and extension.  She still ambulates avoiding heel strike but in mid stance does put her heel down.  She has no tenderness over the plantar fascial origin.  Posterior tibial gastrocsoleus is normal no sciatic notch tenderness no tenderness over the lumbar spine negative popliteal compression test normal logroll of the hips.  Specialty Comments:  No  specialty comments available.  Imaging: Left knee MRI done at [facility] shows mild medial plica mild small Baker's cyst mild knee effusion.  Intact ligaments and normal meniscus without arthritic changes.  02/07/2019    PMFS History: Patient Active Problem List   Diagnosis Date Noted  . Plica syndrome of left knee 02/12/2019  . Synovitis of left knee 01/04/2019  . Current every day smoker 06/30/2017   Past Medical History:  Diagnosis Date  . Allergy     Family History  Problem Relation Age of Onset  . Hypertension Mother   . Diabetes Mother   . Depression Mother   . Cancer Father 47       renal  . Renal cancer Father   . Diabetes Maternal Grandmother   . Leukemia Paternal Grandfather   . Prostate cancer Neg Hx   . Colon cancer Neg Hx     No past surgical history on file. Social History   Occupational History  . Not on file  Tobacco Use  . Smoking status: Current Some Day Smoker    Packs/day: 0.50    Years: 10.00    Pack years: 5.00    Types: Cigarettes    Start date: 01/01/1998  . Smokeless tobacco: Current User  Substance and Sexual Activity  . Alcohol use: Yes    Alcohol/week: 6.0 standard drinks    Types: 6 Cans of beer per week  . Drug use: No  . Sexual activity: Yes    Birth control/protection: I.U.D.

## 2019-02-09 ENCOUNTER — Encounter: Payer: Self-pay | Admitting: Orthopaedic Surgery

## 2019-02-12 DIAGNOSIS — M659 Synovitis and tenosynovitis, unspecified: Secondary | ICD-10-CM | POA: Diagnosis not present

## 2019-02-12 DIAGNOSIS — M6752 Plica syndrome, left knee: Secondary | ICD-10-CM | POA: Insufficient documentation

## 2019-02-12 MED ORDER — BUPIVACAINE HCL 0.5 % IJ SOLN
3.0000 mL | INTRAMUSCULAR | Status: AC | PRN
  Administered 2019-02-12: 11:00:00 3 mL via INTRA_ARTICULAR

## 2019-02-12 MED ORDER — LIDOCAINE HCL 1 % IJ SOLN
0.5000 mL | INTRAMUSCULAR | Status: AC | PRN
  Administered 2019-02-12: .5 mL

## 2019-02-12 MED ORDER — METHYLPREDNISOLONE ACETATE 40 MG/ML IJ SUSP
40.0000 mg | INTRAMUSCULAR | Status: AC | PRN
  Administered 2019-02-12: 40 mg via INTRA_ARTICULAR

## 2019-02-13 ENCOUNTER — Other Ambulatory Visit: Payer: Self-pay

## 2019-02-13 DIAGNOSIS — L989 Disorder of the skin and subcutaneous tissue, unspecified: Secondary | ICD-10-CM

## 2019-02-22 ENCOUNTER — Telehealth: Payer: Self-pay

## 2019-02-22 MED ORDER — PREDNISONE 20 MG PO TABS: ORAL_TABLET | ORAL | 0 refills | Status: DC

## 2019-02-22 NOTE — Telephone Encounter (Signed)
Steroids sent to pharmacy °

## 2019-02-22 NOTE — Telephone Encounter (Signed)
Patient has been battling poison for 2 weeks. Is there any meds that can be called in for her? She has tried everything OTC. Please advise

## 2019-02-22 NOTE — Telephone Encounter (Signed)
Aware. 

## 2019-03-26 DIAGNOSIS — D485 Neoplasm of uncertain behavior of skin: Secondary | ICD-10-CM | POA: Diagnosis not present

## 2019-04-30 DIAGNOSIS — Z6825 Body mass index (BMI) 25.0-25.9, adult: Secondary | ICD-10-CM | POA: Diagnosis not present

## 2019-04-30 DIAGNOSIS — Z0000 Encounter for general adult medical examination without abnormal findings: Secondary | ICD-10-CM | POA: Diagnosis not present

## 2019-05-07 ENCOUNTER — Ambulatory Visit: Payer: Medicaid Other | Admitting: Family Medicine

## 2019-05-08 ENCOUNTER — Encounter: Payer: Self-pay | Admitting: Nurse Practitioner

## 2019-05-08 ENCOUNTER — Other Ambulatory Visit: Payer: Self-pay

## 2019-05-08 ENCOUNTER — Ambulatory Visit: Payer: Medicaid Other | Admitting: Nurse Practitioner

## 2019-05-08 VITALS — BP 103/72 | HR 84 | Temp 97.3°F | Resp 20 | Ht 68.0 in | Wt 171.0 lb

## 2019-05-08 DIAGNOSIS — S161XXA Strain of muscle, fascia and tendon at neck level, initial encounter: Secondary | ICD-10-CM

## 2019-05-08 MED ORDER — CYCLOBENZAPRINE HCL 10 MG PO TABS: 10.0000 mg | ORAL_TABLET | Freq: Three times a day (TID) | ORAL | 1 refills | Status: DC | PRN

## 2019-05-08 MED ORDER — PREDNISONE 10 MG (21) PO TBPK: ORAL_TABLET | ORAL | 0 refills | Status: DC

## 2019-05-08 NOTE — Progress Notes (Signed)
   Subjective:    Patient ID: Haley Allen, female    DOB: 14-Oct-1980, 38 y.o.   MRN: [id number]   Chief Complaint: right neck and shoulder  HPI Patient comes in today c/o right shoulder and upper back pain. Started 1 week ago and has gradually gotten worse. Rates pain 5-/ intrmittently. Movement increases pain. Sitting still helps. She has taken ibuprofen which helps some.    Review of Systems  Constitutional: Negative for activity change and appetite change.  HENT: Negative.   Eyes: Negative for pain.  Respiratory: Negative for shortness of breath.   Cardiovascular: Negative for chest pain, palpitations and leg swelling.  Gastrointestinal: Negative for abdominal pain.  Endocrine: Negative for polydipsia.  Genitourinary: Negative.   Skin: Negative for rash.  Neurological: Negative for dizziness, weakness and headaches.  Hematological: Does not bruise/bleed easily.  Psychiatric/Behavioral: Negative.   All other systems reviewed and are negative.      Objective:   Physical Exam Vitals signs and nursing note reviewed.  Constitutional:      Appearance: Normal appearance.  Cardiovascular:     Rate and Rhythm: Normal rate and regular rhythm.     Heart sounds: Normal heart sounds.  Pulmonary:     Effort: Pulmonary effort is normal.     Breath sounds: Normal breath sounds.  Musculoskeletal:     Comments: No point tenderness of right shouder or neck FROM of right shoulder without pain FROM of neck with pain on extesnion and rotation to right. Grips equal bil  Skin:    General: Skin is warm.  Neurological:     General: No focal deficit present.     Mental Status: She is alert and oriented to person, place, and time.     Sensory: No sensory deficit.     Coordination: Coordination normal.     Deep Tendon Reflexes: Reflexes normal.  Psychiatric:        Mood and Affect: Mood normal.        Behavior: Behavior normal.    BP 103/72   Pulse 84   Temp (!) 97.3 F (36.3 C)  (Temporal)   Resp 20   Ht 5\' 8"  (1.727 m)   Wt 171 lb (77.6 kg)   SpO2 100%   BMI 26.00 kg/m         Assessment & Plan:  Haley Allen in today with chief complaint of Shoulder Pain (right ) and overactive bladder   1. Strain of neck muscle, initial encounter Moist heat Rest  RTO prn - predniSONE (STERAPRED UNI-PAK 21 TAB) 10 MG (21) TBPK tablet; As directed x 6 days  Dispense: 21 tablet; Refill: 0 - cyclobenzaprine (FLEXERIL) 10 MG tablet; Take 1 tablet (10 mg total) by mouth 3 (three) times daily as needed for muscle spasms.  Dispense: 30 tablet; Refill: [place], FNP

## 2019-05-08 NOTE — Patient Instructions (Signed)
Shoulder Pain Many things can cause shoulder pain, including:  An injury.  Moving the shoulder in the same way again and again (overuse).  Joint pain (arthritis). Pain can come from:  Swelling and irritation (inflammation) of any part of the shoulder.  An injury to the shoulder joint.  An injury to: ? Tissues that connect muscle to bone (tendons). ? Tissues that connect bones to each other (ligaments). ? Bones. Follow these instructions at home: Watch for changes in your symptoms. Let your doctor know about them. Follow these instructions to help with your pain. If you have a sling:  Wear the sling as told by your doctor. Remove it only as told by your doctor.  Loosen the sling if your fingers: ? Tingle. ? Become numb. ? Turn cold and blue.  Keep the sling clean.  If the sling is not waterproof: ? Do not let it get wet. ? Take the sling off when you shower or bathe. Managing pain, stiffness, and swelling   If told, put ice on the painful area: ? Put ice in a plastic bag. ? Place a towel between your skin and the bag. ? Leave the ice on for 20 minutes, 2-3 times a day. Stop putting ice on if it does not help with the pain.  Squeeze a soft ball or a foam pad as much as possible. This prevents swelling in the shoulder. It also helps to strengthen the arm. General instructions  Take over-the-counter and prescription medicines only as told by your doctor.  Keep all follow-up visits as told by your doctor. This is important. Contact a doctor if:  Your pain gets worse.  Medicine does not help your pain.  You have new pain in your arm, hand, or fingers. Get help right away if:  Your arm, hand, or fingers: ? Tingle. ? Are numb. ? Are swollen. ? Are painful. ? Turn white or blue. Summary  Shoulder pain can be caused by many things. These include injury, moving the shoulder in the same away again and again, and joint pain.  Watch for changes in your symptoms.  Let your doctor know about them.  This condition may be treated with a sling, ice, and pain medicine.  Contact your doctor if the pain gets worse or you have new pain. Get help right away if your arm, hand, or fingers tingle or get numb, swollen, or painful.  Keep all follow-up visits as told by your doctor. This is important. This information is not intended to replace advice given to you by your health care provider. Make sure you discuss any questions you have with your health care provider. Document Released: 11/24/2007 Document Revised: 12/20/2017 Document Reviewed: 12/20/2017 Elsevier Patient Education  2020 [employer]. Cervical Sprain  A cervical sprain is a stretch or tear in one or more of the tough, cord-like tissues that connect bones (ligaments) in the neck. Cervical sprains can range from mild to severe. Severe cervical sprains can cause the spinal bones (vertebrae) in the neck to be unstable. This can lead to spinal cord damage and can result in serious nervous system problems. The amount of time that it takes for a cervical sprain to get better depends on the cause and extent of the injury. Most cervical sprains heal in 4-6 weeks. What are the causes? Cervical sprains may be caused by an injury (trauma), such as from a motor vehicle accident, a fall, or sudden forward and backward whipping movement of the head and neck (  whiplash injury). Mild cervical sprains may be caused by wear and tear over time, such as from poor posture, sitting in a chair that does not provide support, or looking up or down for long periods of time. What increases the risk? The following factors may make you more likely to develop this condition:  Participating in activities that have a high risk of trauma to the neck. These include contact sports, auto racing, gymnastics, and diving.  Taking risks when driving or riding in a motor vehicle, such as speeding.  Having osteoarthritis of the spine.   Having poor strength and flexibility of the neck.  A previous neck injury.  Having poor posture.  Spending a lot of time in certain positions that put stress on the neck, such as sitting at a computer for long periods of time. What are the signs or symptoms? Symptoms of this condition include:  Pain, soreness, stiffness, tenderness, swelling, or a burning sensation in the front, back, or sides of the neck.  Sudden tightening of neck muscles that you cannot control (muscle spasms).  Pain in the shoulders or upper back.  Limited ability to move the neck.  Headache.  Dizziness.  Nausea.  Vomiting.  Weakness, numbness, or tingling in a hand or an arm. Symptoms may develop right away after injury, or they may develop over a few days. In some cases, symptoms may go away with treatment and return (recur) over time. How is this diagnosed? This condition may be diagnosed based on:  Your medical history.  Your symptoms.  Any recent injuries or known neck problems that you have, such as arthritis in the neck.  A physical exam.  Imaging tests, such as: ? X-rays. ? MRI. ? CT scan. How is this treated? This condition is treated by resting and icing the injured area and doing physical therapy exercises. Depending on the severity of your condition, treatment may also include:  Keeping your neck in place (immobilized) for periods of time. This may be done using: ? A cervical collar. This supports your chin and the back of your head. ? A cervical traction device. This is a sling that holds up your head. This removes weight and pressure from your neck, and it may help to relieve pain.  Medicines that help to relieve pain and inflammation.  Medicines that help to relax your muscles (muscle relaxants).  Surgery. This is rare. Follow these instructions at home: If you have a cervical collar:   Wear it as told by your health care provider. Do not remove the collar unless  instructed by your health care provider.  Ask your health care provider before you make any adjustments to your collar.  If you have long hair, keep it outside of the collar.  Ask your health care provider if you can remove the collar for cleaning and bathing. If you are allowed to remove the collar for cleaning or bathing: ? Follow instructions from your health care provider about how to remove the collar safely. ? Clean the collar by wiping it with mild soap and water and drying it completely. ? If your collar has removable pads, remove them every 1-2 days and wash them by hand with soap and water. Let them air-dry completely before you put them back in the collar. ? Check your skin under the collar for irritation or sores. If you see any, tell your health care provider. Managing pain, stiffness, and swelling   If directed, use a cervical traction device as  told by your health care provider.  If directed, apply heat to the affected area before you do your physical therapy or as often as told by your health care provider. Use the heat source that your health care provider recommends, such as a moist heat pack or a heating pad. ? Place a towel between your skin and the heat source. ? Leave the heat on for 20-30 minutes. ? Remove the heat if your skin turns bright red. This is especially important if you are unable to feel pain, heat, or cold. You may have a greater risk of getting burned.  If directed, put ice on the affected area: ? Put ice in a plastic bag. ? Place a towel between your skin and the bag. ? Leave the ice on for 20 minutes, 2-3 times a day. Activity  Do not drive while wearing a cervical collar. If you do not have a cervical collar, ask your health care provider if it is safe to drive while your neck heals.  Do not drive or use heavy machinery while taking prescription pain medicine or muscle relaxants, unless your health care provider approves.  Do not lift anything  that is heavier than 10 lb (4.5 kg) until your health care provider tells you that it is safe.  Rest as directed by your health care provider. Avoid positions and activities that make your symptoms worse. Ask your health care provider what activities are safe for you.  If physical therapy was prescribed, do exercises as told by your health care provider or physical therapist. General instructions  Take over-the-counter and prescription medicines only as told by your health care provider.  Do not use any products that contain nicotine or tobacco, such as cigarettes and e-cigarettes. These can delay healing. If you need help quitting, ask your health care provider.  Keep all follow-up visits as told by your health care provider or physical therapist. This is important. How is this prevented? To prevent a cervical sprain from happening again:  Use and maintain good posture. Make any needed adjustments to your workstation to help you use good posture.  Exercise regularly as directed by your health care provider or physical therapist.  Avoid risky activities that may cause a cervical sprain. Contact a health care provider if:  You have symptoms that get worse or do not get better after 2 weeks of treatment.  You have pain that gets worse or does not get better with medicine.  You develop new, unexplained symptoms.  You have sores or irritated skin on your neck from wearing your cervical collar. Get help right away if:  You have severe pain.  You develop numbness, tingling, or weakness in any part of your body.  You cannot move a part of your body (you have paralysis).  You have neck pain along with: ? Severe dizziness. ? Headache. Summary  A cervical sprain is a stretch or tear in one or more of the tough, cord-like tissues that connect bones (ligaments) in the neck.  Cervical sprains may be caused by an injury (trauma), such as from a motor vehicle accident, a fall, or sudden  forward and backward whipping movement of the head and neck (whiplash injury).  Symptoms may develop right away after injury, or they may develop over a few days.  This condition is treated by resting and icing the injured area and doing physical therapy exercises. This information is not intended to replace advice given to you by your health care provider.  Make sure you discuss any questions you have with your health care provider. Document Released: 04/04/2007 Document Revised: 09/27/2018 Document Reviewed: 02/04/2016 Elsevier Patient Education  2020 [employer].

## 2019-05-10 ENCOUNTER — Ambulatory Visit: Payer: Medicaid Other | Admitting: Family Medicine

## 2019-05-31 ENCOUNTER — Telehealth: Payer: Self-pay

## 2019-05-31 NOTE — Telephone Encounter (Signed)
Mailbox full

## 2019-05-31 NOTE — Telephone Encounter (Signed)
Patient finished all of the steroid but neck and shoulder are still tight and painful. Should she come back or what should she do next?

## 2019-05-31 NOTE — Telephone Encounter (Signed)
Get message and see if helps

## 2019-06-08 NOTE — Telephone Encounter (Signed)
Patient notified

## 2019-06-18 ENCOUNTER — Other Ambulatory Visit: Payer: Self-pay | Admitting: Nurse Practitioner

## 2019-06-18 DIAGNOSIS — L989 Disorder of the skin and subcutaneous tissue, unspecified: Secondary | ICD-10-CM

## 2019-06-18 NOTE — Progress Notes (Unsigned)
Ref dermatology  

## 2019-06-27 DIAGNOSIS — L700 Acne vulgaris: Secondary | ICD-10-CM | POA: Diagnosis not present

## 2019-06-27 DIAGNOSIS — D225 Melanocytic nevi of trunk: Secondary | ICD-10-CM | POA: Diagnosis not present

## 2019-07-26 ENCOUNTER — Ambulatory Visit (INDEPENDENT_AMBULATORY_CARE_PROVIDER_SITE_OTHER): Payer: Medicaid Other | Admitting: Family Medicine

## 2019-07-26 ENCOUNTER — Encounter: Payer: Self-pay | Admitting: Family Medicine

## 2019-07-26 DIAGNOSIS — H9202 Otalgia, left ear: Secondary | ICD-10-CM

## 2019-07-26 MED ORDER — AMOXICILLIN 500 MG PO CAPS: 500.0000 mg | ORAL_CAPSULE | Freq: Two times a day (BID) | ORAL | 0 refills | Status: AC

## 2019-07-26 NOTE — Progress Notes (Signed)
   Virtual Visit via Telephone Note  I connected with Haley Allen on 07/26/19 at 4:07 PM by telephone and verified that I am speaking with the correct person using two identifiers. Haley Allen is currently located at work and nobody is currently with her during this visit. The provider, Gwenlyn Fudge, FNP is located in their home at time of visit.  I discussed the limitations, risks, security and privacy concerns of performing an evaluation and management service by telephone and the availability of in person appointments. I also discussed with the patient that there may be a patient responsible charge related to this service. The patient expressed understanding and agreed to proceed.  Subjective: PCP: Raliegh Ip, DO  Chief Complaint  Patient presents with  . Otalgia   Patient presents with left ear pain that she describes as an ache.  Patient reports she has really bad allergies.  She is currently having a lot of drainage due to her allergies and states that she always tries to spit it out.  Currently she can feel the drainage moving in her ear when she spits it out.  There is some mild pressure in the left ear as well.  Symptoms began 1 day ago and are gradually worsening since that time. Patient denies fever, rhinorrhea, and sneezing.    ROS: Per HPI  Current Outpatient Medications:  .  cetirizine (ZYRTEC) 10 MG tablet, Take by mouth., Disp: , Rfl:  .  cyclobenzaprine (FLEXERIL) 10 MG tablet, Take 1 tablet (10 mg total) by mouth 3 (three) times daily as needed for muscle spasms., Disp: 30 tablet, Rfl: 1 .  fluticasone (FLONASE) 50 MCG/ACT nasal spray, Place 2 sprays into both nostrils daily., Disp: 16 g, Rfl: 6  No Known Allergies Past Medical History:  Diagnosis Date  . Allergy     Observations/Objective: A&O  No respiratory distress or wheezing audible over the phone Mood, judgement, and thought processes all WNL  Assessment and Plan: 1. Acute otalgia, left -  amoxicillin (AMOXIL) 500 MG capsule; Take 1 capsule (500 mg total) by mouth 2 (two) times daily for 7 days.  Dispense: 14 capsule; Refill: 0   Follow Up Instructions:  I discussed the assessment and treatment plan with the patient. The patient was provided an opportunity to ask questions and all were answered. The patient agreed with the plan and demonstrated an understanding of the instructions.   The patient was advised to call back or seek an in-person evaluation if the symptoms worsen or if the condition fails to improve as anticipated.  The above assessment and management plan was discussed with the patient. The patient verbalized understanding of and has agreed to the management plan. Patient is aware to call the clinic if symptoms persist or worsen. Patient is aware when to return to the clinic for a follow-up visit. Patient educated on when it is appropriate to go to the emergency department.   Time call ended: 4:13 PM  I provided 9 minutes of non-face-to-face time during this encounter.  Deliah Boston, MSN, APRN, FNP-C [facility] 07/26/19

## 2019-08-30 ENCOUNTER — Encounter: Payer: Self-pay | Admitting: Nurse Practitioner

## 2019-08-30 ENCOUNTER — Telehealth (INDEPENDENT_AMBULATORY_CARE_PROVIDER_SITE_OTHER): Payer: Medicaid Other | Admitting: Nurse Practitioner

## 2019-08-30 DIAGNOSIS — J0120 Acute ethmoidal sinusitis, unspecified: Secondary | ICD-10-CM

## 2019-08-30 MED ORDER — AMOXICILLIN-POT CLAVULANATE 875-125 MG PO TABS: 1.0000 | ORAL_TABLET | Freq: Two times a day (BID) | ORAL | 0 refills | Status: DC

## 2019-08-30 NOTE — Progress Notes (Signed)
Virtual Visit via video Note   Due to COVID-19 pandemic this visit was conducted virtually. This visit type was conducted due to national recommendations for restrictions regarding the COVID-19 Pandemic (e.g. social distancing, sheltering in place) in an effort to limit this patient's exposure and mitigate transmission in our community. All issues noted in this document were discussed and addressed.  A physical exam was not performed with this format.  I connected with@ on 08/30/19 at 2:00 by video and verified that I am speaking with the correct person using two identifiers. Haley Allen is currently located at work and no one is currently with her during visit. The provider, [name] Hassell Done, FNP is located in their office at time of visit.  I discussed the limitations, risks, security and privacy concerns of performing an evaluation and management service by telephone and the availability of in person appointments. I also discussed with the patient that there may be a patient responsible charge related to this service. The patient expressed understanding and agreed to proceed.   History and Present Illness:   Chief Complaint: Sinusitis   HPI patient calls in for video visit with c/o facial pressure and congestion. She has had this for over a week and feels no better today. She has not loss her sense of taste or smell.   Review of Systems  Constitutional: Negative for diaphoresis and weight loss.  Eyes: Negative for blurred vision, double vision and pain.  Respiratory: Negative for shortness of breath.   Cardiovascular: Negative for chest pain, palpitations, orthopnea and leg swelling.  Gastrointestinal: Negative for abdominal pain.  Skin: Negative for rash.  Neurological: Negative for dizziness, sensory change, loss of consciousness, weakness and headaches.  Endo/Heme/Allergies: Negative for polydipsia. Does not bruise/bleed easily.  Psychiatric/Behavioral: Negative for  memory loss. The patient does not have insomnia.   All other systems reviewed and are negative.      Observations/Objective: Alert and oriented- answers all questions appropriately No distress Face flushed Sinus pressure along bridge of nose.  Assessment and Plan: Haley Allen in today with chief complaint of Sinusitis   1. Acute non-recurrent ethmoidal sinusitis 1. Take meds as prescribed 2. Use a cool mist humidifier especially during the winter months and when heat has been humid. 3. Use saline nose sprays frequently 4. Saline irrigations of the nose can be very helpful if done frequently.  * 4X daily for 1 week*  * Use of a nettie pot can be helpful with this. Follow directions with this* 5. Drink plenty of fluids 6. Keep thermostat turn down low 7.For any cough or congestion  Use plain Mucinex- regular strength or max strength is fine   * Children- consult with Pharmacist for dosing 8. For fever or aces or pains- take tylenol or ibuprofen appropriate for age and weight.  * for fevers greater than 101 orally you may alternate ibuprofen and tylenol every  3 hours.    Meds ordered this encounter  Medications  . amoxicillin-clavulanate (AUGMENTIN) 875-125 MG tablet    Sig: Take 1 tablet by mouth 2 (two) times daily.    Dispense:  14 tablet    Refill:  0    Order Specific Question:   Supervising Provider    Answer:   Caryl Pina A [[id number]]      Follow Up Instructions:    I discussed the assessment and treatment plan with the patient. The patient was provided an opportunity to ask questions and all were answered. The patient agreed with  the plan and demonstrated an understanding of the instructions.   The patient was advised to call back or seek an in-person evaluation if the symptoms worsen or if the condition fails to improve as anticipated.  The above assessment and management plan was discussed with the patient. The patient verbalized understanding of  and has agreed to the management plan. Patient is aware to call the clinic if symptoms persist or worsen. Patient is aware when to return to the clinic for a follow-up visit. Patient educated on when it is appropriate to go to the emergency department.   Time call ended: 2:10  I provided 10 minutes of face-to-face time during this encounter.    [name], FNP

## 2019-09-24 DIAGNOSIS — Z20828 Contact with and (suspected) exposure to other viral communicable diseases: Secondary | ICD-10-CM | POA: Diagnosis not present

## 2020-04-30 DIAGNOSIS — Z0000 Encounter for general adult medical examination without abnormal findings: Secondary | ICD-10-CM | POA: Diagnosis not present

## 2020-04-30 DIAGNOSIS — Z6827 Body mass index (BMI) 27.0-27.9, adult: Secondary | ICD-10-CM | POA: Diagnosis not present

## 2020-04-30 DIAGNOSIS — Z01419 Encounter for gynecological examination (general) (routine) without abnormal findings: Secondary | ICD-10-CM | POA: Diagnosis not present

## 2020-09-02 DIAGNOSIS — Z1231 Encounter for screening mammogram for malignant neoplasm of breast: Secondary | ICD-10-CM | POA: Diagnosis not present

## 2020-09-10 ENCOUNTER — Encounter: Payer: Self-pay | Admitting: Nurse Practitioner

## 2020-09-10 ENCOUNTER — Ambulatory Visit (INDEPENDENT_AMBULATORY_CARE_PROVIDER_SITE_OTHER): Payer: Medicaid Other | Admitting: Nurse Practitioner

## 2020-09-10 DIAGNOSIS — R928 Other abnormal and inconclusive findings on diagnostic imaging of breast: Secondary | ICD-10-CM | POA: Diagnosis not present

## 2020-09-10 DIAGNOSIS — B359 Dermatophytosis, unspecified: Secondary | ICD-10-CM

## 2020-09-10 DIAGNOSIS — N6001 Solitary cyst of right breast: Secondary | ICD-10-CM | POA: Diagnosis not present

## 2020-09-10 MED ORDER — TERBINAFINE HCL 1 % EX CREA: 1.0000 "application " | TOPICAL_CREAM | Freq: Two times a day (BID) | CUTANEOUS | 0 refills | Status: DC

## 2020-09-10 NOTE — Progress Notes (Signed)
   Virtual Visit via telephone Note Due to COVID-19 pandemic this visit was conducted virtually. This visit type was conducted due to national recommendations for restrictions regarding the COVID-19 Pandemic (e.g. social distancing, sheltering in place) in an effort to limit this patient's exposure and mitigate transmission in our community. All issues noted in this document were discussed and addressed.  A physical exam was not performed with this format.  I connected with Haley Allen on 09/10/20 at *10:10** by telephone and verified that I am speaking with the correct person using two identifiers. Haley Allen is currently located at home and no one is currently with her during visit. The provider, [name], FNP is located in their office at time of visit.  I discussed the limitations, risks, security and privacy concerns of performing an evaluation and management service by telephone and the availability of in person appointments. I also discussed with the patient that there may be a patient responsible charge related to this service. The patient expressed understanding and agreed to proceed.   History and Present Illness:   Chief Complaint: Tinea   HPI She calls instating she has ringworm an her arm and back. Both are about the size of a quarter.    Review of Systems  Constitutional: Negative.   Respiratory: Negative.   Cardiovascular: Negative.   Neurological: Negative.   Psychiatric/Behavioral: Negative.   All other systems reviewed and are negative.    Observations/Objective: Alert and oriented- answers all questions appropriately No distress describes areas as red around the borders with central clearing   Assessment and Plan: Haley Allen in today with chief complaint of Tinea   1. Ringworm Avoid scratching or rubbing Good handwashing - terbinafine (LAMISIL AT) 1 % cream; Apply 1 application topically 2 (two) times daily.  Dispense: 30 g; Refill:  0    Follow Up Instructions: prn    I discussed the assessment and treatment plan with the patient. The patient was provided an opportunity to ask questions and all were answered. The patient agreed with the plan and demonstrated an understanding of the instructions.   The patient was advised to call back or seek an in-person evaluation if the symptoms worsen or if the condition fails to improve as anticipated.  The above assessment and management plan was discussed with the patient. The patient verbalized understanding of and has agreed to the management plan. Patient is aware to call the clinic if symptoms persist or worsen. Patient is aware when to return to the clinic for a follow-up visit. Patient educated on when it is appropriate to go to the emergency department.   Time call ended:  1022  I provided 12 minutes of non-face-to-face time during this encounter.    [name], FNP

## 2020-09-11 DIAGNOSIS — R928 Other abnormal and inconclusive findings on diagnostic imaging of breast: Secondary | ICD-10-CM

## 2020-09-11 DIAGNOSIS — N6310 Unspecified lump in the right breast, unspecified quadrant: Secondary | ICD-10-CM

## 2020-09-26 ENCOUNTER — Other Ambulatory Visit: Payer: Self-pay

## 2020-09-26 DIAGNOSIS — S161XXA Strain of muscle, fascia and tendon at neck level, initial encounter: Secondary | ICD-10-CM

## 2020-09-26 MED ORDER — CYCLOBENZAPRINE HCL 10 MG PO TABS: 10.0000 mg | ORAL_TABLET | Freq: Three times a day (TID) | ORAL | 1 refills | Status: DC | PRN

## 2020-11-06 ENCOUNTER — Other Ambulatory Visit: Payer: Self-pay

## 2020-11-06 ENCOUNTER — Encounter: Payer: Self-pay | Admitting: Nurse Practitioner

## 2020-11-06 ENCOUNTER — Ambulatory Visit (INDEPENDENT_AMBULATORY_CARE_PROVIDER_SITE_OTHER): Payer: Medicaid Other | Admitting: Nurse Practitioner

## 2020-11-06 VITALS — BP 109/75 | HR 92 | Temp 98.6°F | Resp 20 | Ht 68.0 in | Wt 173.0 lb

## 2020-11-06 DIAGNOSIS — R609 Edema, unspecified: Secondary | ICD-10-CM

## 2020-11-06 MED ORDER — FUROSEMIDE 20 MG PO TABS: 20.0000 mg | ORAL_TABLET | Freq: Every day | ORAL | 3 refills | Status: DC

## 2020-11-06 NOTE — Progress Notes (Signed)
Subjective:    Patient ID: Haley Allen, female    DOB: 1981/01/30, 40 y.o.   MRN: [id number]   Chief Complaint: Hands swelling   HPI Patient come sin c/o bil hands swelling daily. Cannot get rings on. Had to have rings resized and they are still to tight. Does not drink a lot of soft drinks. Drinks 5- 6 bottles of water a day. Voids frequently. No lower ext edema.   Review of Systems  Constitutional: Negative for diaphoresis.  Eyes: Negative for pain.  Respiratory: Negative for shortness of breath.   Cardiovascular: Negative for chest pain, palpitations and leg swelling.  Gastrointestinal: Negative for abdominal pain.  Endocrine: Negative for polydipsia.  Skin: Negative for rash.  Neurological: Negative for dizziness, weakness and headaches.  Hematological: Does not bruise/bleed easily.  All other systems reviewed and are negative.      Objective:   Physical Exam Vitals and nursing note reviewed.  Constitutional:      General: She is not in acute distress.    Appearance: Normal appearance. She is well-developed.  HENT:     Head: Normocephalic.     Nose: Nose normal.  Eyes:     Pupils: Pupils are equal, round, and reactive to light.  Neck:     Vascular: No carotid bruit or JVD.  Cardiovascular:     Rate and Rhythm: Normal rate and regular rhythm.     Heart sounds: Normal heart sounds.  Pulmonary:     Effort: Pulmonary effort is normal. No respiratory distress.     Breath sounds: Normal breath sounds. No wheezing or rales.  Chest:     Chest wall: No tenderness.  Abdominal:     General: Bowel sounds are normal. There is no distension or abdominal bruit.     Palpations: Abdomen is soft. There is no hepatomegaly, splenomegaly, mass or pulsatile mass.     Tenderness: There is no abdominal tenderness.  Musculoskeletal:        General: Normal range of motion.     Cervical back: Normal range of motion and neck supple.  Lymphadenopathy:     Cervical: No cervical  adenopathy.  Skin:    General: Skin is warm and dry.     Comments: Mild edema of bil hands  Neurological:     Mental Status: She is alert and oriented to person, place, and time.     Deep Tendon Reflexes: Reflexes are normal and symmetric.  Psychiatric:        Behavior: Behavior normal.        Thought Content: Thought content normal.        Judgment: Judgment normal.    BP 109/75   Pulse 92   Temp 98.6 F (37 C) (Temporal)   Resp 20   Ht 5\' 8"  (1.727 m)   Wt 173 lb (78.5 kg)   SpO2 98%   BMI 26.30 kg/m        Assessment & Plan:  Haley Allen in today with chief complaint of Hands swelling   1. Peripheral edema Continue to drink water daily Lasix  On prn basis - furosemide (LASIX) 20 MG tablet; Take 1 tablet (20 mg total) by mouth daily.  Dispense: 30 tablet; Refill: 3    The above assessment and management plan was discussed with the patient. The patient verbalized understanding of and has agreed to the management plan. Patient is aware to call the clinic if symptoms persist or worsen. Patient is aware when to return to  the clinic for a follow-up visit. Patient educated on when it is appropriate to go to the emergency department.   [name] Hassell Done, FNP

## 2020-11-06 NOTE — Patient Instructions (Signed)
Peripheral Edema  Peripheral edema is swelling that is caused by a buildup of fluid. Peripheral edema most often affects the lower legs, ankles, and feet. It can also develop in the arms, hands, and face. The area of the body that has peripheral edema will look swollen. It may also feel heavy or warm. Your clothes may start to feel tight. Pressing on the area may make a temporary dent in your skin. You may not be able to move your swollen arm or leg as much as usual. There are many causes of peripheral edema. It can happen because of a complication of other conditions such as congestive heart failure, kidney disease, or a problem with your blood circulation. It also can be a side effect of certain medicines or because of an infection. It often happens to women during pregnancy. Sometimes, the cause is not known. Follow these instructions at home: Managing pain, stiffness, and swelling  Raise (elevate) your legs while you are sitting or lying down.  Move around often to prevent stiffness and to lessen swelling.  Do not sit or stand for long periods of time.  Wear support stockings as told by your health care provider.   Medicines  Take over-the-counter and prescription medicines only as told by your health care provider.  Your health care provider may prescribe medicine to help your body get rid of excess water (diuretic). General instructions  Pay attention to any changes in your symptoms.  Follow instructions from your health care provider about limiting salt (sodium) in your diet. Sometimes, eating less salt may reduce swelling.  Moisturize skin daily to help prevent skin from cracking and draining.  Keep all follow-up visits as told by your health care provider. This is important. Contact a health care provider if you have:  A fever.  Edema that starts suddenly or is getting worse, especially if you are pregnant or have a medical condition.  Swelling in only one leg.  Increased  swelling, redness, or pain in one or both of your legs.  Drainage or sores at the area where you have edema. Get help right away if you:  Develop shortness of breath, especially when you are lying down.  Have pain in your chest or abdomen.  Feel weak.  Feel faint. Summary  Peripheral edema is swelling that is caused by a buildup of fluid. Peripheral edema most often affects the lower legs, ankles, and feet.  Move around often to prevent stiffness and to lessen swelling. Do not sit or stand for long periods of time.  Pay attention to any changes in your symptoms.  Contact a health care provider if you have edema that starts suddenly or is getting worse, especially if you are pregnant or have a medical condition.  Get help right away if you develop shortness of breath, especially when lying down. This information is not intended to replace advice given to you by your health care provider. Make sure you discuss any questions you have with your health care provider. Document Revised: 03/01/2018 Document Reviewed: 03/01/2018 Elsevier Patient Education  2021 Elsevier Inc.  

## 2020-11-28 ENCOUNTER — Other Ambulatory Visit: Payer: Self-pay

## 2020-11-28 DIAGNOSIS — S161XXA Strain of muscle, fascia and tendon at neck level, initial encounter: Secondary | ICD-10-CM

## 2020-11-28 MED ORDER — CYCLOBENZAPRINE HCL 10 MG PO TABS
10.0000 mg | ORAL_TABLET | Freq: Three times a day (TID) | ORAL | 1 refills | Status: DC | PRN
Start: 2020-11-28 — End: 2021-01-29

## 2020-12-01 ENCOUNTER — Other Ambulatory Visit: Payer: Self-pay

## 2020-12-01 DIAGNOSIS — R609 Edema, unspecified: Secondary | ICD-10-CM

## 2020-12-03 ENCOUNTER — Other Ambulatory Visit: Payer: Self-pay

## 2020-12-03 ENCOUNTER — Other Ambulatory Visit: Payer: Medicaid Other

## 2020-12-03 DIAGNOSIS — R609 Edema, unspecified: Secondary | ICD-10-CM

## 2020-12-04 ENCOUNTER — Other Ambulatory Visit: Payer: Self-pay

## 2020-12-04 DIAGNOSIS — E875 Hyperkalemia: Secondary | ICD-10-CM

## 2020-12-04 LAB — CMP14+EGFR
ALT: 10 IU/L (ref 0–32)
AST: 20 IU/L (ref 0–40)
Albumin/Globulin Ratio: 1.7 (ref 1.2–2.2)
Albumin: 4.5 g/dL (ref 3.8–4.8)
Alkaline Phosphatase: 69 IU/L (ref 44–121)
BUN/Creatinine Ratio: 14 (ref 9–23)
BUN: 11 mg/dL (ref 6–24)
Bilirubin Total: 0.4 mg/dL (ref 0.0–1.2)
CO2: 24 mmol/L (ref 20–29)
Calcium: 9.4 mg/dL (ref 8.7–10.2)
Chloride: 103 mmol/L (ref 96–106)
Creatinine, Ser: 0.81 mg/dL (ref 0.57–1.00)
Globulin, Total: 2.7 g/dL (ref 1.5–4.5)
Glucose: 83 mg/dL (ref 65–99)
Potassium: 5.4 mmol/L — ABNORMAL HIGH (ref 3.5–5.2)
Sodium: 139 mmol/L (ref 134–144)
Total Protein: 7.2 g/dL (ref 6.0–8.5)
eGFR: 93 mL/min/{1.73_m2} (ref >59)

## 2020-12-04 LAB — CBC WITH DIFFERENTIAL/PLATELET
Basophils Absolute: 0.1 10*3/uL (ref 0.0–0.2)
Basos: 1 %
EOS (ABSOLUTE): 0.3 10*3/uL (ref 0.0–0.4)
Eos: 4 %
Hematocrit: 44.9 % (ref 34.0–46.6)
Hemoglobin: 15.1 g/dL (ref 11.1–15.9)
Immature Grans (Abs): 0.1 10*3/uL (ref 0.0–0.1)
Immature Granulocytes: 1 %
Lymphocytes Absolute: 2.4 10*3/uL (ref 0.7–3.1)
Lymphs: 31 %
MCH: 31.3 pg (ref 26.6–33.0)
MCHC: 33.6 g/dL (ref 31.5–35.7)
MCV: 93 fL (ref 79–97)
Monocytes Absolute: 0.5 10*3/uL (ref 0.1–0.9)
Monocytes: 7 %
Neutrophils Absolute: 4.2 10*3/uL (ref 1.4–7.0)
Neutrophils: 56 %
Platelets: 319 10*3/uL (ref 150–450)
RBC: 4.82 x10E6/uL (ref 3.77–5.28)
RDW: 13.1 % (ref 11.7–15.4)
WBC: 7.6 10*3/uL (ref 3.4–10.8)

## 2020-12-04 LAB — LIPID PANEL
Chol/HDL Ratio: 4.2 ratio (ref 0.0–4.4)
Cholesterol, Total: 223 mg/dL — ABNORMAL HIGH (ref 100–199)
HDL: 53 mg/dL (ref >39)
LDL Chol Calc (NIH): 154 mg/dL — ABNORMAL HIGH (ref 0–99)
Triglycerides: 88 mg/dL (ref 0–149)
VLDL Cholesterol Cal: 16 mg/dL (ref 5–40)

## 2020-12-05 ENCOUNTER — Other Ambulatory Visit: Payer: Medicaid Other

## 2020-12-05 ENCOUNTER — Other Ambulatory Visit: Payer: Self-pay

## 2020-12-05 DIAGNOSIS — E875 Hyperkalemia: Secondary | ICD-10-CM

## 2020-12-05 LAB — BMP8+EGFR
BUN/Creatinine Ratio: 12 (ref 9–23)
BUN: 10 mg/dL (ref 6–24)
CO2: 24 mmol/L (ref 20–29)
Calcium: 9.2 mg/dL (ref 8.7–10.2)
Chloride: 100 mmol/L (ref 96–106)
Creatinine, Ser: 0.81 mg/dL (ref 0.57–1.00)
Glucose: 81 mg/dL (ref 65–99)
Potassium: 4.4 mmol/L (ref 3.5–5.2)
Sodium: 138 mmol/L (ref 134–144)
eGFR: 93 mL/min/{1.73_m2} (ref >59)

## 2020-12-27 ENCOUNTER — Other Ambulatory Visit: Payer: Self-pay | Admitting: Nurse Practitioner

## 2020-12-27 DIAGNOSIS — R609 Edema, unspecified: Secondary | ICD-10-CM

## 2021-01-06 ENCOUNTER — Other Ambulatory Visit: Payer: Self-pay

## 2021-01-06 ENCOUNTER — Encounter: Payer: Self-pay | Admitting: Nurse Practitioner

## 2021-01-06 ENCOUNTER — Ambulatory Visit: Payer: Medicaid Other | Admitting: Nurse Practitioner

## 2021-01-06 VITALS — BP 134/84 | HR 102 | Temp 98.1°F | Resp 20 | Ht 68.0 in | Wt 173.0 lb

## 2021-01-06 DIAGNOSIS — R079 Chest pain, unspecified: Secondary | ICD-10-CM

## 2021-01-06 NOTE — Progress Notes (Signed)
   Subjective:    Patient ID: Haley Allen, female    DOB: 03-03-1981, 40 y.o.   MRN: [id number]   Chief Complaint: chest pain  HPI Patient comes in today c/o of intermittent chest pain and edema. She was seen for edema several months ago and was put on lasix to take on as needed basis. She recently got married and her weight has gone up since then. She said she had chest pain Friday night after eating. Was a sharp pain. She said then she felt winded last night for 2-3 minutes and she was not doing anything. She said her apple watch will sometimes show her heart rate over 100 when she is sitting and doing nothing.    Wt Readings from Last 3 Encounters:  01/06/21 173 lb (78.5 kg)  11/06/20 173 lb (78.5 kg)  05/08/19 171 lb (77.6 kg)       Review of Systems  Constitutional:  Negative for diaphoresis.  Eyes:  Negative for pain.  Respiratory:  Negative for shortness of breath.   Cardiovascular:  Negative for chest pain, palpitations and leg swelling.  Gastrointestinal:  Negative for abdominal pain.  Endocrine: Negative for polydipsia.  Skin:  Negative for rash.  Neurological:  Negative for dizziness, weakness and headaches.  Hematological:  Does not bruise/bleed easily.  All other systems reviewed and are negative.     Objective:   Physical Exam Vitals reviewed.  Constitutional:      Appearance: Normal appearance.  Cardiovascular:     Rate and Rhythm: Normal rate and regular rhythm.     Heart sounds: Normal heart sounds.  Pulmonary:     Effort: Pulmonary effort is normal.     Breath sounds: Normal breath sounds.  Skin:    General: Skin is warm.  Neurological:     General: No focal deficit present.     Mental Status: She is alert and oriented to person, place, and time.  Psychiatric:        Mood and Affect: Mood normal.        Behavior: Behavior normal.    BP 134/84   Pulse (!) 102   Temp 98.1 F (36.7 C) (Temporal)   Resp 20   Ht 5\' 8"  (1.727 m)   Wt 173 lb  (78.5 kg)   SpO2 99%   BMI 26.30 kg/m    EKG- NSR-[name] , FNP     Assessment & Plan:   Haley Allen in today with chief complaint of Chest Pain   1. Chest pain, unspecified type Stress management Heart clear. - EKG 12-Lead    The above assessment and management plan was discussed with the patient. The patient verbalized understanding of and has agreed to the management plan. Patient is aware to call the clinic if symptoms persist or worsen. Patient is aware when to return to the clinic for a follow-up visit. Patient educated on when it is appropriate to go to the emergency department.   [name] Arnette Felts, FNP

## 2021-01-06 NOTE — Patient Instructions (Signed)
Sinus Tachycardia Sinus tachycardia is a kind of fast heartbeat. In sinus tachycardia, the heart beats more than 100 times a minute. Sinus tachycardia starts in a part of the heart called the sinus node. Sinus tachycardia may be harmless, or it may be a sign of a serious condition. What are the causes? This condition may be caused by: Exercise or exertion. A fever. Pain. Loss of body fluids (dehydration). Severe bleeding (hemorrhage). Anxiety and stress. Certain substances, including: Alcohol. Caffeine. Tobacco and nicotine products. Cold medicines. Illegal drugs. Medical conditions including: Heart disease. An infection. An overactive thyroid (hyperthyroidism). A lack of red blood cells (anemia). What are the signs or symptoms? Symptoms of this condition include: A feeling that the heart is beating quickly (palpitations). Suddenly noticing your heartbeat (cardiac awareness). Dizziness. Tiredness (fatigue). Shortness of breath. Chest pain. Nausea. Fainting. How is this diagnosed? This condition is diagnosed with: A physical exam. Other tests, such as: Blood tests. An electrocardiogram (ECG). This test measures the electrical activity of the heart. Ambulatory cardiac monitor. This records your heartbeats for 24 hours or more. You may be referred to a heart specialist (cardiologist). How is this treated? Treatment for this condition depends on the cause or the underlying condition. Treatment may involve: Treating the underlying condition. Taking new medicines or changing your current medicines as told by your health care provider. Making changes to your diet or lifestyle. Follow these instructions at home: Lifestyle  Do not use any products that contain nicotine or tobacco, such as cigarettes and e-cigarettes. If you need help quitting, ask your health care provider. Do not use illegal drugs, such as cocaine. Learn relaxation methods to help you when you get stressed or  anxious. These include deep breathing. Avoid caffeine or other stimulants. Alcohol use  Do not drink alcohol if: Your health care provider tells you not to drink. You are pregnant, may be pregnant, or are planning to become pregnant. If you drink alcohol, limit how much you have: 0-1 drink a day for women. 0-2 drinks a day for men. Be aware of how much alcohol is in your drink. In the [place] one drink equals one typical bottle of beer (12 oz), one-half glass of wine (5 oz), or one shot of hard liquor (1 oz). General instructions Drink enough fluids to keep your urine pale yellow. Take over-the-counter and prescription medicines only as told by your health care provider. Keep all follow-up visits as told by your health care provider. This is important. Contact a health care provider if you have: A fever. Vomiting or diarrhea that does not go away. Get help right away if you: Have pain in your chest, upper arms, jaw, or neck. Become weak or dizzy. Feel faint. Have palpitations that do not go away. Summary In sinus tachycardia, the heart beats more than 100 times a minute. Sinus tachycardia may be harmless, or it may be a sign of a serious condition. Treatment for this condition depends on the cause or the underlying condition. Get help right away if you have pain in your chest, upper arms, jaw, or neck. This information is not intended to replace advice given to you by your health care provider. Make sure you discuss any questions you have with your health care provider. Document Revised: 07/27/2017 Document Reviewed: 07/27/2017 Elsevier Patient Education  2022 Elsevier Inc.  

## 2021-01-28 ENCOUNTER — Other Ambulatory Visit: Payer: Self-pay | Admitting: Nurse Practitioner

## 2021-01-28 DIAGNOSIS — R609 Edema, unspecified: Secondary | ICD-10-CM

## 2021-01-28 DIAGNOSIS — S161XXA Strain of muscle, fascia and tendon at neck level, initial encounter: Secondary | ICD-10-CM

## 2021-01-29 MED ORDER — FUROSEMIDE 20 MG PO TABS: 20.0000 mg | ORAL_TABLET | Freq: Every day | ORAL | 0 refills | Status: DC

## 2021-02-19 ENCOUNTER — Other Ambulatory Visit (INDEPENDENT_AMBULATORY_CARE_PROVIDER_SITE_OTHER): Payer: Medicaid Other

## 2021-02-19 ENCOUNTER — Ambulatory Visit (INDEPENDENT_AMBULATORY_CARE_PROVIDER_SITE_OTHER): Payer: Medicaid Other | Admitting: Orthopaedic Surgery

## 2021-02-19 ENCOUNTER — Encounter: Payer: Self-pay | Admitting: Orthopaedic Surgery

## 2021-02-19 ENCOUNTER — Other Ambulatory Visit: Payer: Self-pay

## 2021-02-19 VITALS — Ht 68.0 in | Wt 170.0 lb

## 2021-02-19 DIAGNOSIS — G8929 Other chronic pain: Secondary | ICD-10-CM | POA: Diagnosis not present

## 2021-02-19 DIAGNOSIS — M542 Cervicalgia: Secondary | ICD-10-CM

## 2021-02-19 DIAGNOSIS — M4722 Other spondylosis with radiculopathy, cervical region: Secondary | ICD-10-CM | POA: Diagnosis not present

## 2021-02-19 DIAGNOSIS — M25511 Pain in right shoulder: Secondary | ICD-10-CM | POA: Diagnosis not present

## 2021-02-19 DIAGNOSIS — M6752 Plica syndrome, left knee: Secondary | ICD-10-CM | POA: Diagnosis not present

## 2021-02-19 MED ORDER — METHYLPREDNISOLONE ACETATE 40 MG/ML IJ SUSP
40.0000 mg | INTRAMUSCULAR | Status: AC | PRN
  Administered 2021-02-19: 40 mg via INTRA_ARTICULAR

## 2021-02-19 MED ORDER — LIDOCAINE HCL 1 % IJ SOLN
0.5000 mL | INTRAMUSCULAR | Status: AC | PRN
  Administered 2021-02-19: .5 mL

## 2021-02-19 MED ORDER — BUPIVACAINE HCL 0.5 % IJ SOLN
1.0000 mL | INTRAMUSCULAR | Status: AC | PRN
  Administered 2021-02-19: 1 mL via INTRA_ARTICULAR

## 2021-02-19 MED ORDER — PREDNISONE 5 MG (21) PO TBPK: ORAL_TABLET | ORAL | 0 refills | Status: DC

## 2021-02-19 NOTE — Progress Notes (Signed)
Office Visit Note   Patient: Haley Allen           Date of Birth: 1981-01-20           MRN: [id number] Visit Date: 02/19/2021              Requested by: Bennie Pierini, FNP [address] [place],  [place] [address] PCP: Bennie Pierini, FNP   Assessment & Plan: Visit Diagnoses:  1. Chronic right shoulder pain   2. Neck pain   3. Plica syndrome of left knee   4. Other spondylosis with radiculopathy, cervical region     Plan: Left knee pes injection performed.  We will send in some prednisone for her neck since this appears to be more likely related to her cervical spondylosis versus pain coming from her shoulder.  Recheck 5 weeks.  Follow-Up Instructions: Return in about 5 weeks (around 03/26/2021).   Orders:  Orders Placed This Encounter  Procedures   Large Joint Inj   XR Cervical Spine 2 or 3 views   XR Shoulder Right   Meds ordered this encounter  Medications   predniSONE (STERAPRED UNI-PAK 21 TAB) 5 MG (21) TBPK tablet    Sig: Take 6,5,4,3,2,1 one tablet less daily , take with food.    Dispense:  21 tablet    Refill:  0      Procedures: Large Joint Inj: L knee on 02/19/2021 11:34 AM Indications: joint swelling and pain Details: 22 G 1.5 in needle, anterolateral approach  Arthrogram: No  Medications: 0.5 mL lidocaine 1 %; 1 mL bupivacaine 0.5 %; 40 mg methylPREDNISolone acetate 40 MG/ML Outcome: tolerated well, no immediate complications Procedure, treatment alternatives, risks and benefits explained, specific risks discussed. Consent was given by the patient. Immediately prior to procedure a time out was called to verify the correct patient, procedure, equipment, support staff and site/side marked as required. Patient was prepped and draped in the usual sterile fashion.      Clinical Data: No additional findings.   Subjective: Chief Complaint  Patient presents with   Left Knee - Pain   Right Shoulder - Pain   Neck - Pain    HPI  40 year old female returns with 2 problems 1 is shoulder pain which actually involves neck pain that radiates all the way down to her hand with intermittent numbness she describes it as a heavy feeling.  She can make popping noises with the right shoulder.  She is to play softball when she was younger.  She is able get her arm up overhead.  At times just with her hands out in front of her holding something she starts having increased aching pain.  The other problem is left knee recurrent medial plica that had previous injection August 2020 which gave her good relief for greater than a year.  Patient denies associated headaches no chills or fever.  Review of Systems all other systems noncontributory to HPI.   Objective: Vital Signs: Ht 5\' 8"  (1.727 m)   Wt 170 lb (77.1 kg)   BMI 25.85 kg/m   Physical Exam Constitutional:      Appearance: She is well-developed.  HENT:     Head: Normocephalic.     Right Ear: External ear normal.     Left Ear: External ear normal. There is no impacted cerumen.  Eyes:     Pupils: Pupils are equal, round, and reactive to light.  Neck:     Thyroid: No thyromegaly.     Trachea:  No tracheal deviation.  Cardiovascular:     Rate and Rhythm: Normal rate.  Pulmonary:     Effort: Pulmonary effort is normal.  Abdominal:     Palpations: Abdomen is soft.  Musculoskeletal:     Cervical back: No rigidity.  Skin:    General: Skin is warm and dry.  Neurological:     Mental Status: She is alert and oriented to person, place, and time.  Psychiatric:        Behavior: Behavior normal.    Ortho Exam tender medial plica mildly thickened mild synovitis left knee.  Collateral ligaments crucial ligament exam is normal negative logroll hips.  Right shoulder she is able to actively make a popping noise.  Long of the biceps is normal negative drop arm test mildly positive impingement.  Brachial plexus tenderness on the right positive Spurling on the right negative Lhermitte.   Upper extremities are 1+ and symmetrical no lower extremity clonus normal heel toe gait.  Specialty Comments:  No specialty comments available.  Imaging: XR Cervical Spine 2 or 3 views  Result Date: 02/19/2021 AP lateral cervical spine x-rays are obtained and reviewed this shows prominent uncovertebral joint spurring at C4-5, C5-6 and C6-7.  Reversal of the mid cervical curve with spondylosis narrowing and spurring at C5-6. Impression: Mid cervical spondylosis.  XR Shoulder Right  Result Date: 02/19/2021 Three-view x-rays right shoulder obtained and reviewed.  This shows no glenohumeral joint no soft tissue calcification.  Bone anatomy is normal.  Acromioclavicular joint is normal. Impression: Normal right shoulder radiographs.    PMFS History: Patient Active Problem List   Diagnosis Date Noted   Other spondylosis with radiculopathy, cervical region 02/19/2021   Plica syndrome of left knee 02/12/2019   Synovitis of left knee 01/04/2019   Current every day smoker 06/30/2017   Past Medical History:  Diagnosis Date   Allergy     Family History  Problem Relation Age of Onset   Hypertension Mother    Diabetes Mother    Depression Mother    Cancer Father 13       renal   Renal cancer Father    Diabetes Maternal Grandmother    Leukemia Paternal Grandfather    Prostate cancer Neg Hx    Colon cancer Neg Hx     No past surgical history on file. Social History   Occupational History   Not on file  Tobacco Use   Smoking status: Some Days    Packs/day: 0.50    Years: 10.00    Pack years: 5.00    Types: Cigarettes    Start date: 01/01/1998   Smokeless tobacco: Current  Vaping Use   Vaping Use: Never used  Substance and Sexual Activity   Alcohol use: Yes    Alcohol/week: 6.0 standard drinks    Types: 6 Cans of beer per week   Drug use: No   Sexual activity: Yes    Birth control/protection: I.U.D.

## 2021-03-03 DIAGNOSIS — L578 Other skin changes due to chronic exposure to nonionizing radiation: Secondary | ICD-10-CM | POA: Diagnosis not present

## 2021-03-03 DIAGNOSIS — L814 Other melanin hyperpigmentation: Secondary | ICD-10-CM | POA: Diagnosis not present

## 2021-03-27 DIAGNOSIS — H5213 Myopia, bilateral: Secondary | ICD-10-CM | POA: Diagnosis not present

## 2021-04-01 ENCOUNTER — Other Ambulatory Visit: Payer: Self-pay | Admitting: Nurse Practitioner

## 2021-04-01 DIAGNOSIS — S161XXA Strain of muscle, fascia and tendon at neck level, initial encounter: Secondary | ICD-10-CM

## 2021-04-02 ENCOUNTER — Other Ambulatory Visit: Payer: Self-pay

## 2021-04-02 ENCOUNTER — Encounter: Payer: Self-pay | Admitting: Orthopaedic Surgery

## 2021-04-02 ENCOUNTER — Ambulatory Visit (INDEPENDENT_AMBULATORY_CARE_PROVIDER_SITE_OTHER): Payer: Medicaid Other | Admitting: Orthopaedic Surgery

## 2021-04-02 VITALS — Ht 68.0 in | Wt 170.0 lb

## 2021-04-02 DIAGNOSIS — M4722 Other spondylosis with radiculopathy, cervical region: Secondary | ICD-10-CM | POA: Diagnosis not present

## 2021-04-02 DIAGNOSIS — M542 Cervicalgia: Secondary | ICD-10-CM

## 2021-04-02 NOTE — Progress Notes (Signed)
Office Visit Note   Patient: Haley Allen           Date of Birth: 09/15/1980           MRN: [id number] Visit Date: 04/02/2021              Requested by: Bennie Pierini, FNP [address] [place],  [place] [address] PCP: Bennie Pierini, FNP   Assessment & Plan: Visit Diagnoses:  1. Neck pain   2. Other spondylosis with radiculopathy, cervical region     Plan: Persistent cervical spondylitic symptoms greater on the right than left failed treatment with anti-inflammatories, muscle relaxants, exercise program, steroid injection with persistent symptoms greater than 3 months.  We will proceed with a cervical MRI scan and office follow-up after scan for review.  Follow-Up Instructions: No follow-ups on file.   Orders:  Orders Placed This Encounter  Procedures   MR Cervical Spine w/o contrast   No orders of the defined types were placed in this encounter.     Procedures: No procedures performed   Clinical Data: No additional findings.   Subjective: Chief Complaint  Patient presents with   Right Shoulder - Follow-up   Neck - Follow-up    HPI 40 year old female returns she continues to have problems with cervical spondylosis with right upper extremity radicular symptoms pain that radiates down to the radial side of her hand.  She took some muscle relaxant her PCP prescribed with possibly some improvement.  Knee is doing better after Cleek injection.  Neck hurts her with rotation turning twisting.  Previous x-ray showed uncovertebral hypertrophy at C4-5 C5-6 with spondylitic changes at C5-6 with narrowing and endplate spurring anterior and posterior. She states her pain is constant nagging tight pulling into her right shoulder and at times is severe. Review of Systems no fever chills no myelopathic symptoms.  No associated bowel or bladder symptoms.  All other systems noncontributory to HPI.   Objective: Vital Signs: Ht 5\' 8"  (1.727 m)   Wt 170 lb  (77.1 kg)   BMI 25.85 kg/m   Physical Exam Constitutional:      Appearance: She is well-developed.  HENT:     Head: Normocephalic.     Right Ear: External ear normal.     Left Ear: External ear normal. There is no impacted cerumen.  Eyes:     Pupils: Pupils are equal, round, and reactive to light.  Neck:     Thyroid: No thyromegaly.     Trachea: No tracheal deviation.  Cardiovascular:     Rate and Rhythm: Normal rate.  Pulmonary:     Effort: Pulmonary effort is normal.  Abdominal:     Palpations: Abdomen is soft.  Musculoskeletal:     Cervical back: No rigidity.  Skin:    General: Skin is warm and dry.  Neurological:     Mental Status: She is alert and oriented to person, place, and time.  Psychiatric:        Behavior: Behavior normal.    Ortho Exam patient has minimal impingement right shoulder.  Long of the biceps nontender.  Exquisite tenderness over the brachial plexus positive Spurling on the right negative on the left.  No thenar atrophy carpal tunnel compression test is negative.  Negative Phalen's.  Specialty Comments:  No specialty comments available.  Imaging: No results found.   PMFS History: Patient Active Problem List   Diagnosis Date Noted   Other spondylosis with radiculopathy, cervical region 02/19/2021   Plica syndrome  of left knee 02/12/2019   Synovitis of left knee 01/04/2019   Current every day smoker 06/30/2017   Past Medical History:  Diagnosis Date   Allergy     Family History  Problem Relation Age of Onset   Hypertension Mother    Diabetes Mother    Depression Mother    Cancer Father 62       renal   Renal cancer Father    Diabetes Maternal Grandmother    Leukemia Paternal Grandfather    Prostate cancer Neg Hx    Colon cancer Neg Hx     No past surgical history on file. Social History   Occupational History   Not on file  Tobacco Use   Smoking status: Some Days    Packs/day: 0.50    Years: 10.00    Pack years: 5.00     Types: Cigarettes    Start date: 01/01/1998   Smokeless tobacco: Current  Vaping Use   Vaping Use: Never used  Substance and Sexual Activity   Alcohol use: Yes    Alcohol/week: 6.0 standard drinks    Types: 6 Cans of beer per week   Drug use: No   Sexual activity: Yes    Birth control/protection: I.U.D.

## 2021-04-16 ENCOUNTER — Other Ambulatory Visit: Payer: Self-pay

## 2021-04-16 ENCOUNTER — Encounter (HOSPITAL_COMMUNITY): Payer: Self-pay

## 2021-04-16 ENCOUNTER — Ambulatory Visit (HOSPITAL_COMMUNITY)
Admission: RE | Admit: 2021-04-16 | Discharge: 2021-04-16 | Disposition: A | Discharge status: Home / Self Care | Payer: Medicaid Other | Source: Ambulatory Visit | Attending: Orthopaedic Surgery | Admitting: Orthopaedic Surgery

## 2021-04-16 DIAGNOSIS — M542 Cervicalgia: Secondary | ICD-10-CM

## 2021-05-01 ENCOUNTER — Other Ambulatory Visit: Payer: Self-pay

## 2021-05-01 ENCOUNTER — Ambulatory Visit (HOSPITAL_COMMUNITY)
Admission: RE | Admit: 2021-05-01 | Discharge: 2021-05-01 | Disposition: A | Discharge status: Home / Self Care | Payer: Medicaid Other | Source: Ambulatory Visit | Attending: Orthopaedic Surgery | Admitting: Orthopaedic Surgery

## 2021-05-01 DIAGNOSIS — M542 Cervicalgia: Secondary | ICD-10-CM | POA: Insufficient documentation

## 2021-05-04 ENCOUNTER — Encounter: Payer: Self-pay | Admitting: Nurse Practitioner

## 2021-05-04 ENCOUNTER — Ambulatory Visit (INDEPENDENT_AMBULATORY_CARE_PROVIDER_SITE_OTHER): Payer: Medicaid Other | Admitting: Nurse Practitioner

## 2021-05-04 DIAGNOSIS — J020 Streptococcal pharyngitis: Secondary | ICD-10-CM

## 2021-05-04 MED ORDER — CEFDINIR 300 MG PO CAPS: 300.0000 mg | ORAL_CAPSULE | Freq: Two times a day (BID) | ORAL | 0 refills | Status: DC

## 2021-05-04 NOTE — Progress Notes (Signed)
   Virtual Visit  Note Due to COVID-19 pandemic this visit was conducted virtually. This visit type was conducted due to national recommendations for restrictions regarding the COVID-19 Pandemic (e.g. social distancing, sheltering in place) in an effort to limit this patient's exposure and mitigate transmission in our community. All issues noted in this document were discussed and addressed.  A physical exam was not performed with this format.  I connected with Haley Allen on 05/04/21 at 3:03 by telephone and verified that I am speaking with the correct person using two identifiers. Haley Allen is currently located at work and no one is currently with  her during visit. The provider, [name], FNP is located in their office at time of visit.  I discussed the limitations, risks, security and privacy concerns of performing an evaluation and management service by telephone and the availability of in person appointments. I also discussed with the patient that there may be a patient responsible charge related to this service. The patient expressed understanding and agreed to proceed.   History and Present Illness: Patient states that she has swollen tonsils with white patches on back of throat. Painful to swallow. Has been exposed to strep throat.    Review of Systems  Constitutional:  Positive for malaise/fatigue. Negative for chills and fever.  HENT:  Positive for sore throat. Negative for congestion.   Respiratory:  Negative for cough.   Musculoskeletal:  Negative for myalgias.  Neurological:  Positive for headaches. Negative for dizziness.    Observations/Objective: Alert and oriented- answers all questions appropriately No distress Describes white patches on back of throat  Assessment and Plan: Haley Allen in today with chief complaint of No chief complaint on file.   1. Strep throat Force fluids Motrin or tylenol OTC OTC decongestant Throat lozenges if  help New toothbrush in 3 days  Meds ordered this encounter  Medications   cefdinir (OMNICEF) 300 MG capsule    Sig: Take 1 capsule (300 mg total) by mouth 2 (two) times daily. 1 po BID    Dispense:  20 capsule    Refill:  0    Order Specific Question:   Supervising Provider    Answer:   Arville Care A [[id number]]      Follow Up Instructions: prn    I discussed the assessment and treatment plan with the patient. The patient was provided an opportunity to ask questions and all were answered. The patient agreed with the plan and demonstrated an understanding of the instructions.   The patient was advised to call back or seek an in-person evaluation if the symptoms worsen or if the condition fails to improve as anticipated.  The above assessment and management plan was discussed with the patient. The patient verbalized understanding of and has agreed to the management plan. Patient is aware to call the clinic if symptoms persist or worsen. Patient is aware when to return to the clinic for a follow-up visit. Patient educated on when it is appropriate to go to the emergency department.   Time call ended:  3:14  I provided 11 minutes of  non face-to-face time during this encounter.    [name], FNP

## 2021-05-05 DIAGNOSIS — Z0000 Encounter for general adult medical examination without abnormal findings: Secondary | ICD-10-CM | POA: Diagnosis not present

## 2021-05-05 DIAGNOSIS — Z6827 Body mass index (BMI) 27.0-27.9, adult: Secondary | ICD-10-CM | POA: Diagnosis not present

## 2021-05-07 ENCOUNTER — Other Ambulatory Visit: Payer: Self-pay

## 2021-05-07 ENCOUNTER — Ambulatory Visit (INDEPENDENT_AMBULATORY_CARE_PROVIDER_SITE_OTHER): Payer: Medicaid Other | Admitting: Orthopaedic Surgery

## 2021-05-07 ENCOUNTER — Encounter: Payer: Self-pay | Admitting: Orthopaedic Surgery

## 2021-05-07 VITALS — Ht 68.0 in | Wt 183.0 lb

## 2021-05-07 DIAGNOSIS — M4722 Other spondylosis with radiculopathy, cervical region: Secondary | ICD-10-CM

## 2021-05-07 NOTE — Progress Notes (Signed)
Office Visit Note   Patient: Haley Allen           Date of Birth: 04-27-1981           MRN: [id number] Visit Date: 05/07/2021              Requested by: Chevis Pretty, [address] [place],  [place] [address] PCP: Chevis Pretty, FNP   Assessment & Plan: Visit Diagnoses:  1. Other spondylosis with radiculopathy, cervical region     Plan: Patient with persistent symptoms C5-6 C6-7 spondylosis with cord flattening some disc extrusion center left at C6-7 and broad-based central protrusion at C5-6.  Bilateral neural foraminal narrowing present at both levels.  We discussed two-level cervical fusion allograft plate and overnight stay in the hospital.  Risk of dysphagia, dysphonia, pseudoarthrosis discussed.  Questions elicited and answered he understands and requests we proceed.  Follow-Up Instructions: No follow-ups on file.   Orders:  No orders of the defined types were placed in this encounter.  No orders of the defined types were placed in this encounter.     Procedures: No procedures performed   Clinical Data: No additional findings.   Subjective: Chief Complaint  Patient presents with   Neck - Pain, Follow-up    MRI cervical spine review    HPI 40 year old female returns with ongoing problems with neck pain and cervical spondylosis.  Previous x-rays showed spondylitic changes at C4-5 C5-6 with anterior posterior endplate spurring.  Patient had pain that radiates from her neck into the right arm radial side of her hand.  She has problems when she rotates her neck.  Pain radiates into the long finger as well on the right.  Pain is constant tight bothers her at night when she tries to sleep.  Patient's failed exercise program, muscle relaxants, anti-inflammatories, prednisone orally, steroid injection with persistent symptoms greater than 4 months.  MRI scan was obtained and is available for review.  Review of Systems previous knee arthroscopy  for medial plica left knee doing well.  Negative for chest pain ,CVA, GI ,GU symptoms.   Objective: Vital Signs: Ht 5\' 8"  (1.727 m)   Wt 183 lb (83 kg)   BMI 27.83 kg/m   Physical Exam Constitutional:      Appearance: She is well-developed.  HENT:     Head: Normocephalic.     Right Ear: External ear normal.     Left Ear: External ear normal. There is no impacted cerumen.  Eyes:     Pupils: Pupils are equal, round, and reactive to light.  Neck:     Thyroid: No thyromegaly.     Trachea: No tracheal deviation.  Cardiovascular:     Rate and Rhythm: Normal rate.  Pulmonary:     Effort: Pulmonary effort is normal.  Abdominal:     Palpations: Abdomen is soft.  Musculoskeletal:     Cervical back: No rigidity.  Skin:    General: Skin is warm and dry.  Neurological:     Mental Status: She is alert and oriented to person, place, and time.  Psychiatric:        Behavior: Behavior normal.    Ortho Exam positive Spurling on the right positive brachial plexus tenderness increased pain with compression minimal improvement with distraction.  Upper extremity reflexes are 2+ and symmetrical.  No lower extremity clonus normal heel toe gait, 2+ lower extremity reflexes.  Specialty Comments:  No specialty comments available.  Imaging: Narrative & Impression  CLINICAL DATA:  Neck pain. Neck pain, chronic, degenerative changes on x-ray; spondylosis C5-6 right greater than left. Additional history provided by scanning technologist: Patient reports right shoulder pain, numbness in right arm, symptoms for 6 months.   EXAM: MRI CERVICAL SPINE WITHOUT CONTRAST   TECHNIQUE: Multiplanar, multisequence MR imaging of the cervical spine was performed. No intravenous contrast was administered.   COMPARISON:  No pertinent prior exams available for comparison.   FINDINGS: Alignment: Reversal of the expected cervical lordosis. Trace C3-C4 grade 1 anterolisthesis.   Vertebrae: Vertebral body  height is maintained. Mild degenerative endplate edema at [id number]. Elsewhere, there is no significant marrow edema or focal suspicious osseous lesion. Small T3 vertebral body hemangioma. C5-C6 ventral osteophyte.   Cord: No signal abnormality is identified within the cervical spinal cord. Slight flattening of the ventral spinal cord at C5-C6, as described below.   Posterior Fossa, vertebral arteries, paraspinal tissues: The right cerebellar tonsil is mildly low-lying, extending up to 4 mm below the level of the foramen magnum. This does not meet measurement criteria for a Chiari 1 malformation. Flow voids preserved within the imaged cervical vertebral arteries. Paraspinal soft tissues unremarkable.   Disc levels:   Moderate disc degeneration at C5-C6. No more than mild disc degeneration at the remaining levels.   C2-C3: No significant disc herniation or stenosis.   C3-C4: Trace grade 1 anterolisthesis. Slight disc uncovering. Shallow central disc protrusion. Minimal uncovertebral hypertrophy on the left. No significant spinal canal or foraminal stenosis.   C4-C5: Shallow disc bulge. Bilateral uncovertebral hypertrophy (greater on the left). No significant spinal canal or foraminal stenosis.   C5-C6: Disc bulge with superimposed broad-based central disc protrusion. Endplate spurring. Left greater than right disc osteophyte ridge/uncinate hypertrophy. The disc protrusion contacts and minimally flattens the ventral spinal cord. However, the dorsal CSF space is maintained. Bilateral neural foraminal narrowing (mild right, moderate left).   C6-C7: Disc bulge. Superimposed broad-based center/left center disc extrusion. The disc extrusion mildly narrows the spinal canal (without spinal cord mass effect). The disc extrusion also mildly narrows the left foraminal entry zone without definite contact upon the exiting left C7 nerve root.   C7-T1: No significant disc herniation or  stenosis.   IMPRESSION: Cervical spondylosis, as outlined and with findings most notably as follows.   At C5-C6, there is moderate disc degeneration with mild degenerative endplate edema. Disc bulge with superimposed broad-based central disc protrusion. Endplate spurring. Left greater than right disc osteophyte ridge/uncinate hypertrophy. The disc protrusion partly effaces the ventral thecal sac, contacting and minimally flattens the ventral spinal cord. However, the dorsal CSF space is maintained within the spinal canal. Bilateral neural foraminal narrowing (mild right, moderate left).   At C6-C7, there is mild disc degeneration. Broad-based center/left center disc extrusion. The disc extrusion mildly narrows the spinal canal (without spinal cord mass effect). The disc extrusion also mildly narrows the left foraminal entry zone without definite contact upon the exiting left C7 nerve root.   No significant spinal canal or foraminal stenosis at the remaining levels.   Reversal of the expected cervical lordosis.   Mildly low-lying right cerebellar tonsil, extending up to 4 mm below level of foramen magnum.     Electronically Signed   By: Kellie Simmering D.O.   On: 05/01/2021 18:19     PMFS History: Patient Active Problem List   Diagnosis Date Noted   Other spondylosis with radiculopathy, cervical region [id number]   Plica syndrome of left knee 02/12/2019   Synovitis of left knee  01/04/2019   Current every day smoker 06/30/2017   Past Medical History:  Diagnosis Date   Allergy     Family History  Problem Relation Age of Onset   Hypertension Mother    Diabetes Mother    Depression Mother    Cancer Father 48       renal   Renal cancer Father    Diabetes Maternal Grandmother    Leukemia Paternal Grandfather    Prostate cancer Neg Hx    Colon cancer Neg Hx     No past surgical history on file. Social History   Occupational History   Not on file  Tobacco Use    Smoking status: Some Days    Packs/day: 0.50    Years: 10.00    Pack years: 5.00    Types: Cigarettes    Start date: 01/01/1998   Smokeless tobacco: Current  Vaping Use   Vaping Use: Never used  Substance and Sexual Activity   Alcohol use: Yes    Alcohol/week: 6.0 standard drinks    Types: 6 Cans of beer per week   Drug use: No   Sexual activity: Yes    Birth control/protection: I.U.D.

## 2021-05-31 ENCOUNTER — Other Ambulatory Visit: Payer: Self-pay | Admitting: Nurse Practitioner

## 2021-05-31 DIAGNOSIS — R609 Edema, unspecified: Secondary | ICD-10-CM

## 2021-06-02 ENCOUNTER — Other Ambulatory Visit: Payer: Self-pay

## 2021-06-03 ENCOUNTER — Other Ambulatory Visit: Payer: Self-pay | Admitting: Nurse Practitioner

## 2021-06-03 DIAGNOSIS — S161XXA Strain of muscle, fascia and tendon at neck level, initial encounter: Secondary | ICD-10-CM

## 2021-06-03 NOTE — Telephone Encounter (Signed)
Last filled 04/02/21 #30 with 1 refill  Ok to refill

## 2021-06-09 NOTE — Progress Notes (Signed)
Surgical Instructions    Your procedure is scheduled on 06/12/21.  Report to [facility] Main Entrance "A" at 5:30 A.M., then check in with the Admitting office.  Call this number if you have problems the morning of surgery:  [phone]   If you have any questions prior to your surgery date call [phone]: Open Monday-Friday 8am-4pm    Remember:  Do not eat after midnight the night before your surgery  You may drink clear liquids until 4:30 the morning of your surgery.   Clear liquids allowed are: Water, Non-Citrus Juices (without pulp), Carbonated Beverages, Clear Tea, Black Coffee ONLY (NO MILK, CREAM OR POWDERED CREAMER of any kind), and Gatorade  Please complete your PRE-SURGERY ENSURE that was provided to you by 4:30 the morning of surgery.  Please, if able, drink it in one setting. DO NOT SIP.     Take these medicines the morning of surgery with A SIP OF WATER:  cetirizine (ZYRTEC)   AS NEEDED: cyclobenzaprine (FLEXERIL) fluticasone (FLONASE)   As of today, STOP taking any Aspirin (unless otherwise instructed by your surgeon) Aleve, Naproxen, Ibuprofen, Motrin, Advil, Goody's, BC's, all herbal medications, fish oil, and all vitamins.   After your COVID test   You are not required to quarantine however you are required to wear a well-fitting mask when you are out and around people not in your household.  If your mask becomes wet or soiled, replace with a new one.  Wash your hands often with soap and water for 20 seconds or clean your hands with an alcohol-based hand sanitizer that contains at least 60% alcohol.  Do not share personal items.  Notify your provider: if you are in close contact with someone who has COVID  or if you develop a fever of 100.4 or greater, sneezing, cough, sore throat, shortness of breath or body aches.          Do not wear jewelry or makeup Do not wear lotions, powders, perfumes or deodorant. Do not shave 48 hours prior to surgery.   Do  not bring valuables to the hospital. DO Not wear nail polish, gel polish, artificial nails, or any other type of covering on natural nails including finger and toenails. If patients have artificial nails, gel coating, etc. that need to be removed by a nail salon, please have this removed prior to surgery or surgery may need to be canceled/delayed if the surgeon/ anesthesia feels like the patient is unable to be adequately monitored.              is not responsible for any belongings or valuables.  Do NOT Smoke (Tobacco/Vaping)  24 hours prior to your procedure  If you use a CPAP at night, you may bring your mask for your overnight stay.   Contacts, glasses, hearing aids, dentures or partials may not be worn into surgery, please bring cases for these belongings   For patients admitted to the hospital, discharge time will be determined by your treatment team.   Patients discharged the day of surgery will not be allowed to drive home, and someone needs to stay with them for 24 hours.  NO VISITORS WILL BE ALLOWED IN PRE-OP WHERE PATIENTS ARE PREPPED FOR SURGERY.  ONLY 1 SUPPORT PERSON MAY BE PRESENT IN THE WAITING ROOM WHILE YOU ARE IN SURGERY.  IF YOU ARE TO BE ADMITTED, ONCE YOU ARE IN YOUR ROOM YOU WILL BE ALLOWED TWO (2) VISITORS. 1 (ONE) VISITOR MAY STAY OVERNIGHT BUT MUST  ARRIVE TO THE ROOM BY 8pm.  Minor children may have two parents present. Special consideration for safety and communication needs will be reviewed on a case by case basis.  Special instructions:    Oral Hygiene is also important to reduce your risk of infection.  Remember - BRUSH YOUR TEETH THE MORNING OF SURGERY WITH YOUR REGULAR TOOTHPASTE   [place]- Preparing For Surgery  Before surgery, you can play an important role. Because skin is not sterile, your skin needs to be as free of germs as possible. You can reduce the number of germs on your skin by washing with CHG (chlorahexidine gluconate) Soap before  surgery.  CHG is an antiseptic cleaner which kills germs and bonds with the skin to continue killing germs even after washing.     Please do not use if you have an allergy to CHG or antibacterial soaps. If your skin becomes reddened/irritated stop using the CHG.  Do not shave (including legs and underarms) for at least 48 hours prior to first CHG shower. It is OK to shave your face.  Please follow these instructions carefully.     Shower the NIGHT BEFORE SURGERY and the MORNING OF SURGERY with CHG Soap.   If you chose to wash your hair, wash your hair first as usual with your normal shampoo. After you shampoo, rinse your hair and body thoroughly to remove the shampoo.  Then [employer] and genitals (private parts) with your normal soap and rinse thoroughly to remove soap.  After that Use CHG Soap as you would any other liquid soap. You can apply CHG directly to the skin and wash gently with a scrungie or a clean washcloth.   Apply the CHG Soap to your body ONLY FROM THE NECK DOWN.  Do not use on open wounds or open sores. Avoid contact with your eyes, ears, mouth and genitals (private parts). Wash Face and genitals (private parts)  with your normal soap.   Wash thoroughly, paying special attention to the area where your surgery will be performed.  Thoroughly rinse your body with warm water from the neck down.  DO NOT shower/wash with your normal soap after using and rinsing off the CHG Soap.  Pat yourself dry with a CLEAN TOWEL.  Wear CLEAN PAJAMAS to bed the night before surgery  Place CLEAN SHEETS on your bed the night before your surgery  DO NOT SLEEP WITH PETS.   Day of Surgery:  Take a shower with CHG soap. Wear Clean/Comfortable clothing the morning of surgery Do not apply any deodorants/lotions.   Remember to brush your teeth WITH YOUR REGULAR TOOTHPASTE.   Please read over the following fact sheets that you were given.

## 2021-06-10 ENCOUNTER — Encounter (HOSPITAL_COMMUNITY)
Admission: RE | Admit: 2021-06-10 | Discharge: 2021-06-10 | Disposition: A | Discharge status: Home / Self Care | Payer: BC Managed Care – PPO | Source: Ambulatory Visit | Attending: Orthopaedic Surgery | Admitting: Orthopaedic Surgery

## 2021-06-10 ENCOUNTER — Encounter (HOSPITAL_COMMUNITY): Payer: Self-pay

## 2021-06-10 ENCOUNTER — Other Ambulatory Visit: Payer: Self-pay

## 2021-06-10 ENCOUNTER — Ambulatory Visit (INDEPENDENT_AMBULATORY_CARE_PROVIDER_SITE_OTHER): Payer: BC Managed Care – PPO | Admitting: Surgery

## 2021-06-10 ENCOUNTER — Encounter: Payer: Self-pay | Admitting: Surgery

## 2021-06-10 VITALS — BP 117/79 | HR 104 | Temp 98.3°F | Resp 18 | Ht 68.0 in | Wt 184.5 lb

## 2021-06-10 DIAGNOSIS — Z01812 Encounter for preprocedural laboratory examination: Secondary | ICD-10-CM | POA: Diagnosis not present

## 2021-06-10 DIAGNOSIS — Z01818 Encounter for other preprocedural examination: Secondary | ICD-10-CM

## 2021-06-10 DIAGNOSIS — Z20822 Contact with and (suspected) exposure to covid-19: Secondary | ICD-10-CM | POA: Insufficient documentation

## 2021-06-10 DIAGNOSIS — M4722 Other spondylosis with radiculopathy, cervical region: Secondary | ICD-10-CM

## 2021-06-10 LAB — CBC
HCT: 42.2 % (ref 36.0–46.0)
Hemoglobin: 14 g/dL (ref 12.0–15.0)
MCH: 31 pg (ref 26.0–34.0)
MCHC: 33.2 g/dL (ref 30.0–36.0)
MCV: 93.4 fL (ref 80.0–100.0)
Platelets: 349 10*3/uL (ref 150–400)
RBC: 4.52 MIL/uL (ref 3.87–5.11)
RDW: 13.2 % (ref 11.5–15.5)
WBC: 11.6 10*3/uL — ABNORMAL HIGH (ref 4.0–10.5)
nRBC: 0 % (ref 0.0–0.2)

## 2021-06-10 LAB — COMPREHENSIVE METABOLIC PANEL
ALT: 14 U/L (ref 0–44)
AST: 21 U/L (ref 15–41)
Albumin: 3.7 g/dL (ref 3.5–5.0)
Alkaline Phosphatase: 69 U/L (ref 38–126)
Anion gap: 6 (ref 5–15)
BUN: 11 mg/dL (ref 6–20)
CO2: 28 mmol/L (ref 22–32)
Calcium: 8.9 mg/dL (ref 8.9–10.3)
Chloride: 102 mmol/L (ref 98–111)
Creatinine, Ser: 0.89 mg/dL (ref 0.44–1.00)
GFR, Estimated: >60 mL/min (ref >60)
Glucose, Bld: 110 mg/dL — ABNORMAL HIGH (ref 70–99)
Potassium: 4.4 mmol/L (ref 3.5–5.1)
Sodium: 136 mmol/L (ref 135–145)
Total Bilirubin: 0.3 mg/dL (ref 0.3–1.2)
Total Protein: 7.3 g/dL (ref 6.5–8.1)

## 2021-06-10 LAB — SARS CORONAVIRUS 2 (TAT 6-24 HRS): SARS Coronavirus 2: NEGATIVE

## 2021-06-10 LAB — SURGICAL PCR SCREEN
MRSA, PCR: NEGATIVE
Staphylococcus aureus: NEGATIVE

## 2021-06-10 NOTE — Progress Notes (Signed)
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  States that symptoms unchanged from previous visit.  She is wanting to proceed with C5-6 and C6-7 ACDF as scheduled.  Today history and physical performed.  Review of systems positive for some nasal congestion and runny nose.  Denies fever chills.  Surgical procedure discussed in detail along with potential rehab/recovery time.  Smoking cessation discussed.  All questions answered.  Discussed with patient chronic hand swelling that she has had over the last several months and primary care provider is treating this with Lasix.  Patient denies any other areas of joint swelling or lower extremity edema.  Postop I asked for patient to remind me about the hand swelling and that I may order a CBC and arthritis panel just to make sure there are not any issues with that.  All questions answered.

## 2021-06-10 NOTE — Progress Notes (Signed)
PCP - Bennie Pierini Cardiologist - denies  PPM/ICD - denies   Chest x-ray - n/a EKG - 01/06/21 Stress Test - denies ECHO - denies Cardiac Cath - denies  Sleep Study - denies  Patient instructed to hold all Aspirin, NSAID's, herbal medications, fish oil and vitamins 7 days prior to surgery.   ERAS Protcol -yes PRE-SURGERY Ensure or G2- ensure given  COVID TEST- 06/10/21 in PAT   Anesthesia review: no  Patient denies shortness of breath, fever, cough and chest pain at PAT appointment   All instructions explained to the patient, with a verbal understanding of the material. Patient agrees to go over the instructions while at home for a better understanding. Patient also instructed to self quarantine after being tested for COVID-19. The opportunity to ask questions was provided.

## 2021-06-11 NOTE — Anesthesia Preprocedure Evaluation (Addendum)
Anesthesia Evaluation  Patient identified by MRN, date of birth, ID band Patient awake    Reviewed: Allergy & Precautions, NPO status , Patient's Chart, lab work & pertinent test results  Airway Mallampati: II  TM Distance: >3 FB Neck ROM: Full    Dental no notable dental hx. (+) Teeth Intact, Dental Advisory Given   Pulmonary Current Smoker and Patient abstained from smoking.,    Pulmonary exam normal breath sounds clear to auscultation       Cardiovascular Exercise Tolerance: Good negative cardio ROS Normal cardiovascular exam Rhythm:Regular Rate:Normal     Neuro/Psych  Neuromuscular disease negative psych ROS   GI/Hepatic negative GI ROS, Neg liver ROS,   Endo/Other  negative endocrine ROS  Renal/GU Lab Results      Component                Value               Date                      CREATININE               0.89                06/10/2021                BUN                      11                  06/10/2021                NA                       136                 06/10/2021                K                        4.4                 06/10/2021                CL                       102                 06/10/2021                CO2                      28                  06/10/2021                Musculoskeletal  (+) Arthritis ,   Abdominal   Peds  Hematology Lab Results      Component                Value               Date                      WBC  11.6 (H)            06/10/2021                HGB                      14.0                06/10/2021                HCT                      42.2                06/10/2021                MCV                      93.4                06/10/2021                PLT                      349                 06/10/2021              Anesthesia Other Findings ALL: Latex  Reproductive/Obstetrics negative OB ROS                             Anesthesia Physical Anesthesia Plan  ASA: 2  Anesthesia Plan: General   Post-op Pain Management: Dilaudid IV and Tylenol PO (pre-op)   Induction: Intravenous  PONV Risk Score and Plan: 3 and Treatment may vary due to age or medical condition, Midazolam and Dexamethasone  Airway Management Planned: Video Laryngoscope Planned and Oral ETT  Additional Equipment:   Intra-op Plan:   Post-operative Plan: Extubation in OR  Informed Consent:     Dental advisory given  Plan Discussed with: CRNA  Anesthesia Plan Comments: (GA)        Anesthesia Quick Evaluation

## 2021-06-11 NOTE — H&P (Signed)
Haley Allen is an 40 y.o. female.   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  States that symptoms unchanged from previous visit.  She is wanting to proceed with C5-6 and C6-7 ACDF as scheduled.  Today history and physical performed.  Review of systems positive for some nasal congestion and runny nose.  Denies fever chills.   Past Medical History:  Diagnosis Date   Allergy     Past Surgical History:  Procedure Laterality Date   WISDOM TOOTH EXTRACTION      Family History  Problem Relation Age of Onset   Hypertension Mother    Diabetes Mother    Depression Mother    Cancer Father 38       renal   Renal cancer Father    Diabetes Maternal Grandmother    Leukemia Paternal Grandfather    Prostate cancer Neg Hx    Colon cancer Neg Hx    Social History:  reports that she has been smoking cigarettes. She started smoking about 23 years ago. She has a 5.00 pack-year smoking history. She uses smokeless tobacco. She reports current alcohol use of about 6.0 standard drinks per week. She reports that she does not use drugs.  Allergies:  Allergies  Allergen Reactions   Latex Other (See Comments)    Skin redness    No medications prior to admission.    Results for orders placed or performed during the hospital encounter of 06/10/21 (from the past 48 hour(s))  SARS CORONAVIRUS 2 (TAT 6-24 HRS) Nasopharyngeal Nasopharyngeal Swab     Status: None   Collection Time: 06/10/21 11:01 AM   Specimen: Nasopharyngeal Swab  Result Value Ref Range   SARS Coronavirus 2 NEGATIVE NEGATIVE    Comment: (NOTE) SARS-CoV-2 target nucleic acids are NOT DETECTED.  The SARS-CoV-2 RNA is generally detectable in upper and lower respiratory specimens during the acute phase of infection. Negative results do not preclude SARS-CoV-2 infection, do not rule out co-infections with other  pathogens, and should not be used as the sole basis for treatment or other patient management decisions. Negative results must be combined with clinical observations, patient history, and epidemiological information. The expected result is Negative.  Fact Sheet for Patients: HairSlick.no  Fact Sheet for Healthcare Providers: quierodirigir.com  This test is not yet approved or cleared by the [place] FDA and  has been authorized for detection and/or diagnosis of SARS-CoV-2 by FDA under an Emergency Use Authorization (EUA). This EUA will remain  in effect (meaning this test can be used) for the duration of the COVID-19 declaration under Se ction 564(b)(1) of the Act, 21 U.S.C. section 360bbb-3(b)(1), unless the authorization is terminated or revoked sooner.  Performed at [facility], [address] [place], [place] [address]   Surgical pcr screen     Status: None   Collection Time: 06/10/21 11:11 AM   Specimen: Nasal Mucosa; Nasal Swab  Result Value Ref Range   MRSA, PCR NEGATIVE NEGATIVE   Staphylococcus aureus NEGATIVE NEGATIVE    Comment: (NOTE) The Xpert SA Assay (FDA approved for NASAL specimens in patients 62 years of age and older), is one component of a comprehensive surveillance program. It is not intended to diagnose infection nor to guide or monitor treatment. Performed at [facility], [address], [place], [place] [address]   CBC     Status: Abnormal  Collection Time: 06/10/21 11:11 AM  Result Value Ref Range   WBC 11.6 (H) 4.0 - 10.5 K/uL   RBC 4.52 3.87 - 5.11 MIL/uL   Hemoglobin 14.0 12.0 - 15.0 g/dL   HCT 70.3 50.0 - 93.8 %   MCV 93.4 80.0 - 100.0 fL   MCH 31.0 26.0 - 34.0 pg   MCHC 33.2 30.0 - 36.0 g/dL   RDW 18.2 99.3 - 71.6 %   Platelets 349 150 - 400 K/uL   nRBC 0.0 0.0 - 0.2 %    Comment: Performed at [facility], [address], [place], [place] [address]   Comprehensive metabolic panel     Status: Abnormal   Collection Time: 06/10/21 11:11 AM  Result Value Ref Range   Sodium 136 135 - 145 mmol/L   Potassium 4.4 3.5 - 5.1 mmol/L   Chloride 102 98 - 111 mmol/L   CO2 28 22 - 32 mmol/L   Glucose, Bld 110 (H) 70 - 99 mg/dL    Comment: Glucose reference range applies only to samples taken after fasting for at least 8 hours.   BUN 11 6 - 20 mg/dL   Creatinine, Ser 3.81 0.44 - 1.00 mg/dL   Calcium 8.9 8.9 - 01.7 mg/dL   Total Protein 7.3 6.5 - 8.1 g/dL   Albumin 3.7 3.5 - 5.0 g/dL   AST 21 15 - 41 U/L   ALT 14 0 - 44 U/L   Alkaline Phosphatase 69 38 - 126 U/L   Total Bilirubin 0.3 0.3 - 1.2 mg/dL   GFR, Estimated >51 >02 mL/min    Comment: (NOTE) Calculated using the CKD-EPI Creatinine Equation (2021)    Anion gap 6 5 - 15    Comment: Performed at [facility], [address], [place], [place] [address]   No results found.  Review of Systems  Constitutional:  Positive for activity change.  HENT:  Positive for congestion and postnasal drip.   Respiratory: Negative.    Gastrointestinal: Negative.   Genitourinary: Negative.   Musculoskeletal:  Positive for neck pain and neck stiffness.  Neurological:  Positive for numbness.  Psychiatric/Behavioral: Negative.     There were no vitals taken for this visit. Physical Exam HENT:     Head: Normocephalic and atraumatic.     Nose: Nose normal.  Eyes:     Extraocular Movements: Extraocular movements intact.     Pupils: Pupils are equal, round, and reactive to light.  Cardiovascular:     Rate and Rhythm: Regular rhythm.     Heart sounds: Normal heart sounds.  Pulmonary:     Effort: Pulmonary effort is normal. No respiratory distress.     Breath sounds: Normal breath sounds.  Abdominal:     General: There is no distension.  Neurological:     Mental Status: She is alert and oriented to person, place, and time.  Psychiatric:        Mood and Affect: Mood normal.      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  Smoking cessation discussed.  All questions answered.  Discussed with patient chronic hand swelling that she has had over the last several months and primary care provider is treating this with Lasix.  Patient denies any other areas of joint swelling or lower extremity edema.  Postop I asked for patient to remind me about the hand swelling and that I may order a CBC and  arthritis panel just to make sure there are not any issues with that.  All questions answered.   Zonia Kief, PA-C 06/11/2021, 2:14 PM

## 2021-06-12 ENCOUNTER — Ambulatory Visit (HOSPITAL_COMMUNITY): Payer: BC Managed Care – PPO

## 2021-06-12 ENCOUNTER — Encounter (HOSPITAL_COMMUNITY): Payer: Self-pay | Admitting: Orthopaedic Surgery

## 2021-06-12 ENCOUNTER — Ambulatory Visit (HOSPITAL_COMMUNITY): Payer: BC Managed Care – PPO | Admitting: Certified Registered Nurse Anesthetist

## 2021-06-12 ENCOUNTER — Other Ambulatory Visit: Payer: Self-pay

## 2021-06-12 ENCOUNTER — Encounter (HOSPITAL_COMMUNITY)
Admission: RE | Disposition: A | Discharge status: Home / Self Care | Payer: Self-pay | Source: Home / Self Care | Attending: Orthopaedic Surgery

## 2021-06-12 ENCOUNTER — Observation Stay (HOSPITAL_COMMUNITY)
Admission: RE | Admit: 2021-06-12 | Discharge: 2021-06-13 | Disposition: A | Discharge status: Home / Self Care | Payer: BC Managed Care – PPO | Attending: Orthopaedic Surgery | Admitting: Orthopaedic Surgery

## 2021-06-12 DIAGNOSIS — M4802 Spinal stenosis, cervical region: Secondary | ICD-10-CM | POA: Diagnosis not present

## 2021-06-12 DIAGNOSIS — M47892 Other spondylosis, cervical region: Secondary | ICD-10-CM | POA: Diagnosis not present

## 2021-06-12 DIAGNOSIS — M47812 Spondylosis without myelopathy or radiculopathy, cervical region: Secondary | ICD-10-CM | POA: Diagnosis not present

## 2021-06-12 DIAGNOSIS — Z8051 Family history of malignant neoplasm of kidney: Secondary | ICD-10-CM | POA: Insufficient documentation

## 2021-06-12 DIAGNOSIS — M4322 Fusion of spine, cervical region: Secondary | ICD-10-CM | POA: Diagnosis not present

## 2021-06-12 DIAGNOSIS — F17210 Nicotine dependence, cigarettes, uncomplicated: Secondary | ICD-10-CM | POA: Diagnosis not present

## 2021-06-12 DIAGNOSIS — M4722 Other spondylosis with radiculopathy, cervical region: Secondary | ICD-10-CM | POA: Diagnosis present

## 2021-06-12 DIAGNOSIS — Z981 Arthrodesis status: Secondary | ICD-10-CM | POA: Diagnosis not present

## 2021-06-12 DIAGNOSIS — M9981 Other biomechanical lesions of cervical region: Secondary | ICD-10-CM | POA: Diagnosis not present

## 2021-06-12 DIAGNOSIS — Z419 Encounter for procedure for purposes other than remedying health state, unspecified: Secondary | ICD-10-CM

## 2021-06-12 DIAGNOSIS — Z01818 Encounter for other preprocedural examination: Secondary | ICD-10-CM

## 2021-06-12 HISTORY — PX: ANTERIOR CERVICAL DECOMP/DISCECTOMY FUSION: SHX1161

## 2021-06-12 LAB — POCT PREGNANCY, URINE: Preg Test, Ur: NEGATIVE

## 2021-06-12 SURGERY — ANTERIOR CERVICAL DECOMPRESSION/DISCECTOMY FUSION 2 LEVELS
Anesthesia: General | Site: Spine Cervical

## 2021-06-12 MED ORDER — ROCURONIUM BROMIDE 10 MG/ML (PF) SYRINGE
PREFILLED_SYRINGE | INTRAVENOUS | Status: AC
  Filled 2021-06-12: qty 10

## 2021-06-12 MED ORDER — MIDAZOLAM HCL 2 MG/2ML IJ SOLN
INTRAMUSCULAR | Status: AC
  Filled 2021-06-12: qty 2

## 2021-06-12 MED ORDER — KETAMINE HCL 50 MG/5ML IJ SOSY
PREFILLED_SYRINGE | INTRAMUSCULAR | Status: AC
  Filled 2021-06-12: qty 5

## 2021-06-12 MED ORDER — DEXAMETHASONE SODIUM PHOSPHATE 10 MG/ML IJ SOLN
INTRAMUSCULAR | Status: DC | PRN
  Administered 2021-06-12: 10 mg via INTRAVENOUS

## 2021-06-12 MED ORDER — PHENOL 1.4 % MT LIQD
1.0000 | OROMUCOSAL | Status: DC | PRN
  Administered 2021-06-12: 14:00:00 1 via OROMUCOSAL
  Filled 2021-06-12: qty 177

## 2021-06-12 MED ORDER — ACETAMINOPHEN 10 MG/ML IV SOLN: 1000.0000 mg | Freq: Once | INTRAVENOUS | Status: DC | PRN

## 2021-06-12 MED ORDER — FENTANYL CITRATE (PF) 250 MCG/5ML IJ SOLN
INTRAMUSCULAR | Status: DC | PRN
  Administered 2021-06-12 (×2): 50 ug via INTRAVENOUS
  Administered 2021-06-12: 100 ug via INTRAVENOUS
  Administered 2021-06-12: 50 ug via INTRAVENOUS

## 2021-06-12 MED ORDER — SODIUM CHLORIDE 0.9 % IV SOLN: 250.0000 mL | INTRAVENOUS | Status: DC

## 2021-06-12 MED ORDER — OXYCODONE HCL 5 MG PO TABS: 5.0000 mg | ORAL_TABLET | Freq: Once | ORAL | Status: DC | PRN

## 2021-06-12 MED ORDER — OXYCODONE-ACETAMINOPHEN 5-325 MG PO TABS: 1.0000 | ORAL_TABLET | ORAL | 0 refills | Status: DC | PRN

## 2021-06-12 MED ORDER — ONDANSETRON HCL 4 MG/2ML IJ SOLN: 4.0000 mg | Freq: Once | INTRAMUSCULAR | Status: DC | PRN

## 2021-06-12 MED ORDER — LIDOCAINE 2% (20 MG/ML) 5 ML SYRINGE
INTRAMUSCULAR | Status: AC
  Filled 2021-06-12: qty 5

## 2021-06-12 MED ORDER — ONDANSETRON HCL 4 MG/2ML IJ SOLN
INTRAMUSCULAR | Status: AC
  Filled 2021-06-12: qty 2

## 2021-06-12 MED ORDER — ACETAMINOPHEN 325 MG PO TABS
650.0000 mg | ORAL_TABLET | ORAL | Status: DC | PRN
  Administered 2021-06-13 (×2): 650 mg via ORAL
  Filled 2021-06-12 (×2): qty 2

## 2021-06-12 MED ORDER — FENTANYL CITRATE (PF) 250 MCG/5ML IJ SOLN
INTRAMUSCULAR | Status: AC
  Filled 2021-06-12: qty 5

## 2021-06-12 MED ORDER — SODIUM CHLORIDE 0.9% FLUSH
3.0000 mL | Freq: Two times a day (BID) | INTRAVENOUS | Status: DC
  Administered 2021-06-12 (×2): 3 mL via INTRAVENOUS

## 2021-06-12 MED ORDER — POLYETHYLENE GLYCOL 3350 17 G PO PACK: 17.0000 g | PACK | Freq: Every day | ORAL | Status: DC

## 2021-06-12 MED ORDER — METHOCARBAMOL 500 MG PO TABS: 500.0000 mg | ORAL_TABLET | Freq: Four times a day (QID) | ORAL | 0 refills | Status: DC | PRN

## 2021-06-12 MED ORDER — FLUTICASONE PROPIONATE 50 MCG/ACT NA SUSP
2.0000 | Freq: Every day | NASAL | Status: DC | PRN
  Administered 2021-06-12: 22:00:00 2 via NASAL
  Filled 2021-06-12: qty 16

## 2021-06-12 MED ORDER — SUGAMMADEX SODIUM 200 MG/2ML IV SOLN
INTRAVENOUS | Status: DC | PRN
  Administered 2021-06-12: 160 mg via INTRAVENOUS

## 2021-06-12 MED ORDER — HYDROMORPHONE HCL 1 MG/ML IJ SOLN
INTRAMUSCULAR | Status: AC
  Filled 2021-06-12: qty 0.5

## 2021-06-12 MED ORDER — ESMOLOL HCL 100 MG/10ML IV SOLN
INTRAVENOUS | Status: DC | PRN
  Administered 2021-06-12: 10 mg via INTRAVENOUS

## 2021-06-12 MED ORDER — SODIUM CHLORIDE 0.9 % IV SOLN: INTRAVENOUS | Status: DC

## 2021-06-12 MED ORDER — SODIUM CHLORIDE 0.9% FLUSH: 3.0000 mL | INTRAVENOUS | Status: DC | PRN

## 2021-06-12 MED ORDER — ACETAMINOPHEN 10 MG/ML IV SOLN
INTRAVENOUS | Status: DC | PRN
  Administered 2021-06-12: 1000 mg via INTRAVENOUS

## 2021-06-12 MED ORDER — OXYCODONE HCL 5 MG PO TABS
5.0000 mg | ORAL_TABLET | ORAL | Status: DC | PRN
  Administered 2021-06-12 – 2021-06-13 (×5): 5 mg via ORAL
  Filled 2021-06-12 (×5): qty 1

## 2021-06-12 MED ORDER — DEXAMETHASONE SODIUM PHOSPHATE 10 MG/ML IJ SOLN
INTRAMUSCULAR | Status: AC
  Filled 2021-06-12: qty 1

## 2021-06-12 MED ORDER — HYDROMORPHONE HCL 1 MG/ML IJ SOLN
0.5000 mg | INTRAMUSCULAR | Status: DC | PRN
  Administered 2021-06-12: 16:00:00 0.5 mg via INTRAVENOUS
  Filled 2021-06-12: qty 0.5

## 2021-06-12 MED ORDER — HYDROMORPHONE HCL 1 MG/ML IJ SOLN
INTRAMUSCULAR | Status: AC
  Administered 2021-06-12: 11:00:00 0.5 mg via INTRAVENOUS
  Filled 2021-06-12: qty 1

## 2021-06-12 MED ORDER — MENTHOL 3 MG MT LOZG
1.0000 | LOZENGE | OROMUCOSAL | Status: DC | PRN
  Administered 2021-06-12: 19:00:00 3 mg via ORAL
  Filled 2021-06-12: qty 9

## 2021-06-12 MED ORDER — HYDROMORPHONE HCL 1 MG/ML IJ SOLN
INTRAMUSCULAR | Status: DC | PRN
Start: 2021-06-12 — End: 2021-06-12
  Administered 2021-06-12: .5 mg via INTRAVENOUS

## 2021-06-12 MED ORDER — ONDANSETRON HCL 4 MG/2ML IJ SOLN
INTRAMUSCULAR | Status: DC | PRN
  Administered 2021-06-12: 4 mg via INTRAVENOUS

## 2021-06-12 MED ORDER — MIDAZOLAM HCL 5 MG/5ML IJ SOLN
INTRAMUSCULAR | Status: DC | PRN
  Administered 2021-06-12: 2 mg via INTRAVENOUS

## 2021-06-12 MED ORDER — BUPIVACAINE HCL 0.25 % IJ SOLN
INTRAMUSCULAR | Status: DC | PRN
  Administered 2021-06-12: 10 mL

## 2021-06-12 MED ORDER — ORAL CARE MOUTH RINSE: 15.0000 mL | Freq: Once | OROMUCOSAL | Status: AC

## 2021-06-12 MED ORDER — 0.9 % SODIUM CHLORIDE (POUR BTL) OPTIME
TOPICAL | Status: DC | PRN
  Administered 2021-06-12: 08:00:00 1000 mL

## 2021-06-12 MED ORDER — AMISULPRIDE (ANTIEMETIC) 5 MG/2ML IV SOLN: 10.0000 mg | Freq: Once | INTRAVENOUS | Status: DC | PRN

## 2021-06-12 MED ORDER — ONDANSETRON HCL 4 MG PO TABS: 4.0000 mg | ORAL_TABLET | Freq: Four times a day (QID) | ORAL | Status: DC | PRN

## 2021-06-12 MED ORDER — DOCUSATE SODIUM 100 MG PO CAPS
100.0000 mg | ORAL_CAPSULE | Freq: Two times a day (BID) | ORAL | Status: DC
  Administered 2021-06-12 (×2): 100 mg via ORAL
  Filled 2021-06-12 (×2): qty 1

## 2021-06-12 MED ORDER — METHOCARBAMOL 500 MG PO TABS
500.0000 mg | ORAL_TABLET | Freq: Four times a day (QID) | ORAL | Status: DC | PRN
  Administered 2021-06-12 – 2021-06-13 (×3): 500 mg via ORAL
  Filled 2021-06-12 (×3): qty 1

## 2021-06-12 MED ORDER — DARIFENACIN HYDROBROMIDE ER 7.5 MG PO TB24
7.5000 mg | ORAL_TABLET | Freq: Every day | ORAL | Status: DC
  Administered 2021-06-12: 22:00:00 7.5 mg via ORAL
  Filled 2021-06-12 (×2): qty 1

## 2021-06-12 MED ORDER — BUPIVACAINE HCL (PF) 0.25 % IJ SOLN
INTRAMUSCULAR | Status: AC
  Filled 2021-06-12: qty 10

## 2021-06-12 MED ORDER — ACETAMINOPHEN 650 MG RE SUPP: 650.0000 mg | RECTAL | Status: DC | PRN

## 2021-06-12 MED ORDER — ONDANSETRON HCL 4 MG/2ML IJ SOLN: 4.0000 mg | Freq: Four times a day (QID) | INTRAMUSCULAR | Status: DC | PRN

## 2021-06-12 MED ORDER — METHOCARBAMOL 1000 MG/10ML IJ SOLN: 500.0000 mg | Freq: Four times a day (QID) | INTRAVENOUS | Status: DC | PRN

## 2021-06-12 MED ORDER — LIDOCAINE 2% (20 MG/ML) 5 ML SYRINGE
INTRAMUSCULAR | Status: DC | PRN
  Administered 2021-06-12: 100 mg via INTRAVENOUS

## 2021-06-12 MED ORDER — OXYCODONE HCL 5 MG/5ML PO SOLN: 5.0000 mg | Freq: Once | ORAL | Status: DC | PRN

## 2021-06-12 MED ORDER — LACTATED RINGERS IV SOLN: INTRAVENOUS | Status: DC

## 2021-06-12 MED ORDER — SURGIFLO WITH THROMBIN (HEMOSTATIC MATRIX KIT) OPTIME
TOPICAL | Status: DC | PRN
  Administered 2021-06-12: 1 via TOPICAL

## 2021-06-12 MED ORDER — PROPOFOL 500 MG/50ML IV EMUL
INTRAVENOUS | Status: DC | PRN
  Administered 2021-06-12: 25 ug/kg/min via INTRAVENOUS

## 2021-06-12 MED ORDER — ROCURONIUM BROMIDE 10 MG/ML (PF) SYRINGE
PREFILLED_SYRINGE | INTRAVENOUS | Status: DC | PRN
  Administered 2021-06-12: 70 mg via INTRAVENOUS
  Administered 2021-06-12: 30 mg via INTRAVENOUS
  Administered 2021-06-12: 10 mg via INTRAVENOUS
  Administered 2021-06-12: 30 mg via INTRAVENOUS

## 2021-06-12 MED ORDER — PROPOFOL 10 MG/ML IV BOLUS
INTRAVENOUS | Status: DC | PRN
  Administered 2021-06-12: 130 mg via INTRAVENOUS

## 2021-06-12 MED ORDER — ESMOLOL HCL 100 MG/10ML IV SOLN
INTRAVENOUS | Status: AC
  Filled 2021-06-12: qty 10

## 2021-06-12 MED ORDER — ACETAMINOPHEN 10 MG/ML IV SOLN
INTRAVENOUS | Status: AC
  Filled 2021-06-12: qty 100

## 2021-06-12 MED ORDER — KETAMINE HCL 10 MG/ML IJ SOLN
INTRAMUSCULAR | Status: DC | PRN
  Administered 2021-06-12 (×3): 10 mg via INTRAVENOUS

## 2021-06-12 MED ORDER — CEFAZOLIN SODIUM-DEXTROSE 2-4 GM/100ML-% IV SOLN
2.0000 g | INTRAVENOUS | Status: AC
  Administered 2021-06-12: 08:00:00 2 g via INTRAVENOUS
  Filled 2021-06-12: qty 100

## 2021-06-12 MED ORDER — LACTATED RINGERS IV SOLN: INTRAVENOUS | Status: DC | PRN

## 2021-06-12 MED ORDER — HYDROMORPHONE HCL 1 MG/ML IJ SOLN
0.2500 mg | INTRAMUSCULAR | Status: DC | PRN
  Administered 2021-06-12: 11:00:00 0.5 mg via INTRAVENOUS

## 2021-06-12 MED ORDER — CHLORHEXIDINE GLUCONATE 0.12 % MT SOLN
15.0000 mL | Freq: Once | OROMUCOSAL | Status: AC
  Administered 2021-06-12: 06:00:00 15 mL via OROMUCOSAL
  Filled 2021-06-12: qty 15

## 2021-06-12 MED ORDER — LORATADINE 10 MG PO TABS: 10.0000 mg | ORAL_TABLET | Freq: Every day | ORAL | Status: DC

## 2021-06-12 SURGICAL SUPPLY — 59 items
BAG COUNTER SPONGE SURGICOUNT (BAG) ×2 IMPLANT
BAG SURGICOUNT SPONGE COUNTING (BAG) ×1
BENZOIN TINCTURE PRP APPL 2/3 (GAUZE/BANDAGES/DRESSINGS) ×5 IMPLANT
BIT DRILL SM SPINE QC 12 (BIT) ×2 IMPLANT
BLADE CLIPPER SURG (BLADE) IMPLANT
BONE CC-ACS 11X14 X8 6D (Bone Implant) ×3 IMPLANT
BONE CC-ACS 11X14X7 6D (Bone Implant) ×3 IMPLANT
BUR ROUND FLUTED 4 SOFT TCH (BURR) ×2 IMPLANT
BUR ROUND FLUTED 4MM SOFT TCH (BURR) ×1
CHIPS BONE CANC-ACS 11X14X8 6D (Bone Implant) IMPLANT
CHIPS BONE CANC-ACS11X14X7 6D (Bone Implant) IMPLANT
CLOSURE WOUND 1/2 X4 (GAUZE/BANDAGES/DRESSINGS) ×1
COLLAR CERV LO CONTOUR FIRM DE (SOFTGOODS) ×2 IMPLANT
CORD BIPOLAR FORCEPS 12FT (ELECTRODE) ×3 IMPLANT
COVER SURGICAL LIGHT HANDLE (MISCELLANEOUS) ×3 IMPLANT
DRAPE C-ARM 42X72 X-RAY (DRAPES) ×3 IMPLANT
DRAPE HALF SHEET 40X57 (DRAPES) ×3 IMPLANT
DRAPE MICROSCOPE LEICA (MISCELLANEOUS) ×3 IMPLANT
DURAPREP 6ML APPLICATOR 50/CS (WOUND CARE) ×3 IMPLANT
ELECT COATED BLADE 2.86 ST (ELECTRODE) ×3 IMPLANT
ELECT REM PT RETURN 9FT ADLT (ELECTROSURGICAL) ×3
ELECTRODE REM PT RTRN 9FT ADLT (ELECTROSURGICAL) ×1 IMPLANT
EVACUATOR 1/8 PVC DRAIN (DRAIN) ×3 IMPLANT
GAUZE SPONGE 4X4 12PLY STRL (GAUZE/BANDAGES/DRESSINGS) ×3 IMPLANT
GLOVE SRG 8 PF TXTR STRL LF DI (GLOVE) ×2 IMPLANT
GLOVE SURG ORTHO LTX SZ7.5 (GLOVE) ×6 IMPLANT
GLOVE SURG UNDER POLY LF SZ8 (GLOVE) ×4
GOWN STRL REUS W/ TWL LRG LVL3 (GOWN DISPOSABLE) ×1 IMPLANT
GOWN STRL REUS W/ TWL XL LVL3 (GOWN DISPOSABLE) ×1 IMPLANT
GOWN STRL REUS W/TWL 2XL LVL3 (GOWN DISPOSABLE) ×3 IMPLANT
GOWN STRL REUS W/TWL LRG LVL3 (GOWN DISPOSABLE) ×2
GOWN STRL REUS W/TWL XL LVL3 (GOWN DISPOSABLE) ×2
HALTER HD/CHIN CERV TRACTION D (MISCELLANEOUS) ×3 IMPLANT
HEMOSTAT SURGICEL 2X14 (HEMOSTASIS) IMPLANT
KIT BASIN OR (CUSTOM PROCEDURE TRAY) ×3 IMPLANT
KIT TURNOVER KIT B (KITS) ×3 IMPLANT
MANIFOLD NEPTUNE II (INSTRUMENTS) IMPLANT
NDL 25GX 5/8IN NON SAFETY (NEEDLE) ×1 IMPLANT
NEEDLE 22X1 1/2 (OR ONLY) (NEEDLE) ×2 IMPLANT
NEEDLE 25GX 5/8IN NON SAFETY (NEEDLE) ×3 IMPLANT
NS IRRIG 1000ML POUR BTL (IV SOLUTION) ×3 IMPLANT
PACK ORTHO CERVICAL (CUSTOM PROCEDURE TRAY) ×3 IMPLANT
PAD ARMBOARD 7.5X6 YLW CONV (MISCELLANEOUS) ×6 IMPLANT
PATTIES SURGICAL .5 X.5 (GAUZE/BANDAGES/DRESSINGS) IMPLANT
PIN TEMP FIXATION KIRSCHNER (EXFIX) ×2 IMPLANT
PLATE ANT CERV XTEND 2 LV (Plate) ×2 IMPLANT
POSITIONER HEAD DONUT 9IN (MISCELLANEOUS) ×3 IMPLANT
RESTRAINT LIMB HOLDER UNIV (RESTRAINTS) IMPLANT
SCREW XTD VAR 4.2 SELF TAP 12 (Screw) ×12 IMPLANT
STRIP CLOSURE SKIN 1/2X4 (GAUZE/BANDAGES/DRESSINGS) ×2 IMPLANT
SURGIFLO W/THROMBIN 8M KIT (HEMOSTASIS) ×2 IMPLANT
SUT BONE WAX W31G (SUTURE) ×3 IMPLANT
SUT VIC AB 3-0 X1 27 (SUTURE) ×3 IMPLANT
SUT VICRYL 4-0 PS2 18IN ABS (SUTURE) ×4 IMPLANT
TAPE CLOTH SURG 4X10 WHT LF (GAUZE/BANDAGES/DRESSINGS) ×2 IMPLANT
TOWEL GREEN STERILE (TOWEL DISPOSABLE) ×3 IMPLANT
TOWEL GREEN STERILE FF (TOWEL DISPOSABLE) ×3 IMPLANT
TRAY FOLEY W/BAG SLVR 16FR (SET/KITS/TRAYS/PACK)
TRAY FOLEY W/BAG SLVR 16FR ST (SET/KITS/TRAYS/PACK) IMPLANT

## 2021-06-12 NOTE — Transfer of Care (Signed)
Immediate Anesthesia Transfer of Care Note  Patient: Haley Allen  Procedure(s) Performed: C5-6, C6-7 ANTERIOR CERVICAL DISCECTOMY FUSION, ALLOGRAFT, PLATE (Spine Cervical)  Patient Location: PACU  Anesthesia Type:General  Level of Consciousness: awake, alert  and oriented  Airway & Oxygen Therapy: Patient Spontanous Breathing and Patient connected to face mask oxygen  Post-op Assessment: Report given to RN, Post -op Vital signs reviewed and stable, Patient moving all extremities X 4 and Patient able to stick tongue midline  Post vital signs: Reviewed  Last Vitals:  Vitals Value Taken Time  BP 142/78 06/12/21 1031  Temp 98.0   Pulse 93 06/12/21 1033  Resp 12 06/12/21 1032  SpO2 100 % 06/12/21 1033  Vitals shown include unvalidated device data.  Last Pain:  Vitals:   06/12/21 0557  TempSrc:   PainSc: 3       Patients Stated Pain Goal: 3 (06/12/21 0557)  Complications: No notable events documented.

## 2021-06-12 NOTE — Progress Notes (Signed)
Orthopedic Tech Progress Note Patient Details:  Haley Allen 11/24/1980 [id number]  Ortho Devices Type of Ortho Device: Soft collar Ortho Device/Splint Interventions: Haley Allen 06/12/2021, 1:51 PM Delivered to room. Its an extra one for the patient.

## 2021-06-12 NOTE — Evaluation (Signed)
Physical Therapy Evaluation Patient Details Name: Haley Allen MRN: [id number] DOB: 1980/10/01 Today's Date: 06/12/2021  History of Present Illness  This 40 y.o. female admitted for C5-6, C6-7 ACDF. due to spondylosis, foraminal stenosis.  PMH non contributory  Clinical Impression  Pt presented supine in bed with HOB elevated, awake and willing to participate in therapy session. Prior to admission, pt reported that she was independent with all functional mobility and ADLs. She lives with her husband in a single level home with four steps to enter (with railing). At the time of evaluation, pt overall at a supervision to independent level with all functional mobility including hallway ambulation without need for an AD. Additionally, she participated in stair training without difficulties. PT reviewed cervical precautions with pt and pt's spouse. No further acute PT needs identified at this time. PT signing off.     Recommendations for follow up therapy are one component of a multi-disciplinary discharge planning process, led by the attending physician.  Recommendations may be updated based on patient status, additional functional criteria and insurance authorization.  Follow Up Recommendations No PT follow up    Assistance Recommended at Discharge None  Functional Status Assessment Patient has not had a recent decline in their functional status  Equipment Recommendations  None recommended by PT    Recommendations for Other Services       Precautions / Restrictions Precautions Precautions: Cervical Precaution Booklet Issued: No Precaution Comments: OT provided handout Required Braces or Orthoses: Cervical Brace Cervical Brace: Soft collar;At all times Restrictions Weight Bearing Restrictions: No      Mobility  Bed Mobility Overal bed mobility: Modified Independent             General bed mobility comments: reviewed log rolling with pt.  She reports she sleeps in a recliner  due to her bed being very high    Transfers Overall transfer level: Independent Equipment used: None                    Ambulation/Gait Ambulation/Gait assistance: Supervision Gait Distance (Feet): 500 Feet Assistive device: None Gait Pattern/deviations: Step-through pattern;Decreased stride length Gait velocity: decreased     General Gait Details: pt with slow, steady gait overall with no overt LOB or need for physical assistance  Stairs Stairs: Yes Stairs assistance: Supervision Stair Management: One rail Left;Alternating pattern;Forwards Number of Stairs: 10 General stair comments: supervision for safety, no difficulty  Wheelchair Mobility    Modified Rankin (Stroke Patients Only)       Balance Overall balance assessment: No apparent balance deficits (not formally assessed)                                           Pertinent Vitals/Pain Pain Assessment: 0-10 Pain Score: 5  Faces Pain Scale: Hurts little more Pain Location: throat Pain Descriptors / Indicators: Burning;Operative site guarding Pain Intervention(s): Monitored during session;Repositioned    Home Living Family/patient expects to be discharged to:: Private residence Living Arrangements: Spouse/significant other;Children Available Help at Discharge: Family;Available 24 hours/day Type of Home: Mobile home Home Access: Stairs to enter Entrance Stairs-Rails: Right;Left;Can reach both Entrance Stairs-Number of Steps: 3-4   Home Layout: One level Home Equipment: None Additional Comments: lives with spouse and 26 y.o. son.  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   Dominant Hand: Right    Extremity/Trunk Assessment   Upper Extremity Assessment Upper Extremity Assessment:  Defer to OT evaluation    Lower Extremity Assessment Lower Extremity Assessment: Overall WFL for tasks assessed    Cervical / Trunk Assessment Cervical / Trunk Assessment: Neck Surgery  Communication   Communication: No difficulties  Cognition Arousal/Alertness: Awake/alert Behavior During Therapy: WFL for tasks assessed/performed Overall Cognitive Status: Within Functional Limits for tasks assessed                                          General Comments General comments (skin integrity, edema, etc.): spouse present and very supportive.  Instructed pt and spouse how to don/doff cervical collar    Exercises     Assessment/Plan    PT Assessment Patient does not need any further PT services  PT Problem List         PT Treatment Interventions      PT Goals (Current goals can be found in the Care Plan section)  Acute Rehab PT Goals Patient Stated Goal: "home tomorrow" PT Goal Formulation: All assessment and education complete, DC therapy    Frequency     Barriers to discharge        Co-evaluation               AM-PAC PT "6 Clicks" Mobility  Outcome Measure Help needed turning from your back to your side while in a flat bed without using bedrails?: None Help needed moving from lying on your back to sitting on the side of a flat bed without using bedrails?: None Help needed moving to and from a bed to a chair (including a wheelchair)?: None Help needed standing up from a chair using your arms (e.g., wheelchair or bedside chair)?: None Help needed to walk in hospital room?: None Help needed climbing 3-5 steps with a railing? : None 6 Click Score: 24    End of Session Equipment Utilized During Treatment: Cervical collar Activity Tolerance: Patient tolerated treatment well Patient left: in bed;with call bell/phone within reach;with family/visitor present Nurse Communication: Mobility status PT Visit Diagnosis: Pain Pain - part of body:   (throat)    Time: 3545-6256 PT Time Calculation (min) (ACUTE ONLY): 11 min   Charges:   PT Evaluation $PT Eval Low Complexity: 1 Low          Ginette Pitman, PT, DPT  Acute Rehabilitation Services Office [phone]   Alessandra Bevels Einar Nolasco 06/12/2021, 2:20 PM

## 2021-06-12 NOTE — Discharge Instructions (Addendum)
Ok to shower when you get home , use extra collar wrapped in saran wrap for shower, dry off and reapply dry collar.   Do not remove steri-strips.  Your dressing is water proof .   No aggressive activity. Cervical collar must be on at all times including when showering.  Do not bend or turn neck.   Recliner type position will decrease neck swelling after surgery.   No driving.

## 2021-06-12 NOTE — Anesthesia Procedure Notes (Signed)
Procedure Name: Intubation Date/Time: 06/12/2021 7:40 AM Performed by: Cy Blamer, CRNA Pre-anesthesia Checklist: Patient identified, Emergency Drugs available, Suction available and Patient being monitored Patient Re-evaluated:Patient Re-evaluated prior to induction Oxygen Delivery Method: Circle system utilized Preoxygenation: Pre-oxygenation with 100% oxygen Induction Type: IV induction Ventilation: Mask ventilation without difficulty Laryngoscope Size: Glidescope and 3 Grade View: Grade I Tube type: Oral Tube size: 7.0 mm Number of attempts: 1 Airway Equipment and Method: Stylet, Video-laryngoscopy and Bite block Placement Confirmation: ETT inserted through vocal cords under direct vision, positive ETCO2 and breath sounds checked- equal and bilateral Secured at: 21 cm Tube secured with: Tape Dental Injury: Teeth and Oropharynx as per pre-operative assessment  Comments: Elective glidescope for c-spine stabilization

## 2021-06-12 NOTE — Anesthesia Postprocedure Evaluation (Signed)
Anesthesia Post Note  Patient: Haley Allen  Procedure(s) Performed: C5-6, C6-7 ANTERIOR CERVICAL DISCECTOMY FUSION, ALLOGRAFT, PLATE (Spine Cervical)     Patient location during evaluation: PACU Anesthesia Type: General Level of consciousness: awake and alert Pain management: pain level controlled Vital Signs Assessment: post-procedure vital signs reviewed and stable Respiratory status: spontaneous breathing, nonlabored ventilation, respiratory function stable and patient connected to nasal cannula oxygen Cardiovascular status: blood pressure returned to baseline and stable Postop Assessment: no apparent nausea or vomiting Anesthetic complications: no   No notable events documented.  Last Vitals:  Vitals:   06/12/21 1130 06/12/21 1156  BP: 127/79 138/73  Pulse: 93 97  Resp: 12 18  Temp: (!) 36.1 C 36.9 C  SpO2: 95% 93%    Last Pain:  Vitals:   06/12/21 1156  TempSrc: Oral  PainSc:                  Trevor Iha

## 2021-06-12 NOTE — Evaluation (Signed)
Occupational Therapy Evaluation Patient Details Name: Haley Allen MRN: [id number] DOB: Mar 26, 1981 Today's Date: 06/12/2021   History of Present Illness This 40 y.o. female admitted for C5-6, C6-7 ACDF. due to spondylosis, foraminal stenosis.  PMH non contributory   Clinical Impression   Patient evaluated by Occupational Therapy with no further acute OT needs identified. All education has been completed and the patient has no further questions. Pt is able to complete ADLs mod I and functional mobility independently.  She demonstrates good awareness of precautions, and all education completed.  See below for any follow-up Occupational Therapy or equipment needs. OT is signing off. Thank you for this referral.       Recommendations for follow up therapy are one component of a multi-disciplinary discharge planning process, led by the attending physician.  Recommendations may be updated based on patient status, additional functional criteria and insurance authorization.   Follow Up Recommendations  No OT follow up    Assistance Recommended at Discharge Intermittent Supervision/Assistance (with IADLs)  Functional Status Assessment  Patient has had a recent decline in their functional status and demonstrates the ability to make significant improvements in function in a reasonable and predictable amount of time.  Equipment Recommendations  None recommended by OT    Recommendations for Other Services       Precautions / Restrictions Precautions Precautions: Cervical Precaution Booklet Issued: Yes (comment) Precaution Comments: reviewed cervical precautions and handout provided Required Braces or Orthoses: Cervical Brace Cervical Brace: Soft collar;At all times      Mobility Bed Mobility Overal bed mobility: Modified Independent             General bed mobility comments: reviewed log rolling with pt.  She reports she sleeps in a recliner due to her bed being very high     Transfers Overall transfer level: Independent                        Balance Overall balance assessment: No apparent balance deficits (not formally assessed)                                         ADL either performed or assessed with clinical judgement   ADL Overall ADL's : Modified independent Eating/Feeding: Independent     Grooming Details (indicate cue type and reason): reviewed safe technique for oral care                               General ADL Comments: reviewed safe technique for grooming and ADLs.  She is aware that she needs to wear cervical collar at all times and demonstrates good understanding and compliance with precautions     Vision Patient Visual Report: No change from baseline       Perception     Praxis      Pertinent Vitals/Pain Pain Assessment: Faces Faces Pain Scale: Hurts little more Pain Location: throat Pain Descriptors / Indicators: Burning;Operative site guarding Pain Intervention(s): Monitored during session     Hand Dominance Right   Extremity/Trunk Assessment Upper Extremity Assessment Upper Extremity Assessment: Overall WFL for tasks assessed   Lower Extremity Assessment Lower Extremity Assessment: Defer to PT evaluation   Cervical / Trunk Assessment Cervical / Trunk Assessment: Neck Surgery   Communication Communication Communication: No difficulties   Cognition Arousal/Alertness: Awake/alert  Behavior During Therapy: WFL for tasks assessed/performed Overall Cognitive Status: Within Functional Limits for tasks assessed                                       General Comments  spouse present and very supportive.  Instructed pt and spouse how to don/doff cervical collar    Exercises     Shoulder Instructions      Home Living Family/patient expects to be discharged to:: Private residence Living Arrangements: Spouse/significant other;Children Available Help at  Discharge: Family;Available 24 hours/day Type of Home: Mobile home Home Access: Stairs to enter Entrance Stairs-Number of Steps: 3-4 Entrance Stairs-Rails: Right;Left;Can reach both Home Layout: One level     Bathroom Shower/Tub: Tub/shower unit         Home Equipment: None   Additional Comments: lives with spouse and 26 y.o. son.  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      OT Treatment/Interventions:      OT Goals(Current goals can be found in the care plan section) Acute Rehab OT Goals Patient Stated Goal: to get back to doing everything OT Goal Formulation: All assessment and education complete, DC therapy  OT Frequency:     Barriers to D/C:            Co-evaluation              AM-PAC OT "6 Clicks" Daily Activity     Outcome Measure Help from another person eating meals?: None Help from another person taking care of personal grooming?: None Help from another person toileting, which includes using toliet, bedpan, or urinal?: None Help from another person bathing (including washing, rinsing, drying)?: None Help from another person to put on and taking off regular upper body clothing?: None Help from another person to put on and taking off regular lower body clothing?: None 6 Click Score: 24   End of Session Equipment Utilized During Treatment: Cervical collar Nurse Communication: Mobility status  Activity Tolerance: Patient tolerated treatment well Patient left: in bed;with call bell/phone within reach  OT Visit Diagnosis: Pain Pain - part of body:  (neck/throat)                Time: 1315-1330 OT Time Calculation (min): 15 min Charges:  OT General Charges $OT Visit: 1 Visit OT Evaluation $OT Eval Low Complexity: 1  Low  Eber Jones., OTR/L Acute Rehabilitation Services Pager [phone] Office [phone]   Jeani Hawking M 06/12/2021, 1:38 PM

## 2021-06-12 NOTE — Op Note (Signed)
Pre and postop diagnosis: Cervical spondylosis C5-6 C6-7.  Postop diagnosis: Same  Procedure: C5-6 C6-7 anterior cervical discectomy and fusion, allograft and plate.  Surgeon: Annell Greening, MD  Assistant: Zonia Kief, PA-C medically necessary and present after exposure for implant patient placement and neuro component protection.  Anesthesia: General oral tracheal  EBL: Less than 100 cc  Implants:Implants  BONE CC-ACS 11X14 X8 6D - [id number]  Inventory Item: BONE CC-ACS 11X14 X8 6D Serial no.: [id number] Model/Cat no.: [id number]  Implant name: BONE CC-ACS 11X14 X8 6D - [id number] Laterality: N/A Area: Cervical Level 5-6  Manufacturer: Tobie Poet FNDN Date of Manufacture:    Action: Implanted Number Used: 1   Device Identifier:  Device Identifier Type:     BONE CC-ACS [id number] 6D - [id number]  Inventory Item: BONE CC-ACS 11X14X7 6D Serial no.: [id number] Model/Cat no.: [id number]  Implant name: BONE CC-ACS 11X14X7 6D - [id number] Laterality: N/A Area: Cervical Level 6-7  Manufacturer: Tobie Poet FNDN Date of Manufacture:    Action: Implanted Number Used: 1   Device Identifier:  Device Identifier Type:     PLATE ANT CERV XTEND 2 LV - [id number]  Inventory Item: PLATE ANT CERV XTEND 2 LV Serial no.:  Model/Cat no.: [id number]  Implant name: PLATE ANT CERV XTEND 2 LV - [id number] Laterality: N/A Area: Spine Cervical  Manufacturer: [employer] Date of Manufacture:    Action: Implanted Number Used: 1   Device Identifier:  Device Identifier Type:     SCREW XTD VAR 4.2 SELF TAP 12 - [id number]  Inventory Item: SCREW XTD VAR 4.2 SELF TAP 12 Serial no.:  Model/Cat no.: [id number]  Implant name: SCREW XTD VAR 4.2 SELF TAP 12 - [id number] Laterality: N/A Area: Spine Cervical  Manufacturer: [employer] Date of Manufacture:    Action: Implanted Number Used: 6   Device Identifier:  Device Identifier Type:      Procedure: After induction of  general anesthesia orotracheal ovation preoperative Ancef prophylaxis timeout procedure negative prepped with DuraPrep.  Squared with towels Betadine Steri-Drape sterile Mayo stand the head thyroid sheets and drapes.  Wrist restraints were applied for pulldown during fluoroscopic spot images.  After timeout procedure a sterile skin marker been used before the Betadine Steri-Drape incision was made starting at the midline extending to the left.  Sharp dissection platysma blunt dissection down to the [employer].  Spurs removed after a short 25 needle was placed in the C5-6 level confirmed with sterilely draped lateral C arm spot image.  Spurs were removed disc was partially excised to Tieler Cournoyer this level.  Self-retaining tractors were placed teeth blades right and left Cloward, smooth blade cephalad caudad.  Edges of the teeth were placed underneath the longus Coley.  Operative microscope was draped brought in we progressed back to the posterior longitudinal ligament taking down the posterior longitudinal ligament removing posterior spurs and decompressing the dura.  Uncovertebral joints were stripped on each side.  Trial sizers were used and an 8 mm graft was placed at this level.  Identical procedure was repeated at the C6-7 level.  At this level there is disc protrusions left paracentral causing more compression on the left than right.  Spurs removed complete take down the posterior longitudinal ligament exposure of the dura.  Some Surgi-Flo was used in the corner.  Epidural space was dry and 7 mm graft was selected countersunk 2 mm with the CRNA pulling had ultra traction as the graft was countersunk.  Graft was stable plate was selected using  globus.  Six screws were placed.  Initially with the spike present plate was in good position however the screws were being tightened down and angled the plate.  We left the top right screw remove the remaining screws suggested the plate good position step second screw in the  C7 vertebrae with plate lined up on AP x-ray and lateral cervical spine and then once this was confirmed good position refill the remaining screw holes.  I Down screwdriver was taken after final images were obtained.  Hemovacs placed in and out technique on the left in line with the skin incision.  Tincture benzoin and Steri-Strips were applied 4 x 4's tape and soft collar.  Patient tolerated procedure well transferred In stable condition.

## 2021-06-12 NOTE — Interval H&P Note (Signed)
History and Physical Interval Note:  06/12/2021 7:20 AM  Haley Allen  has presented today for surgery, with the diagnosis of C5-6, C6-7 spondylosis, foraminal stenosis.  The various methods of treatment have been discussed with the patient and family. After consideration of risks, benefits and other options for treatment, the patient has consented to  Procedure(s): C5-6, C6-7 ANTERIOR CERVICAL DISCECTOMY FUSION, ALLOGRAFT, PLATE (N/A) as a surgical intervention.  The patient's history has been reviewed, patient examined, no change in status, stable for surgery.  I have reviewed the patient's chart and labs.  Questions were answered to the patient's satisfaction.     Eldred Manges

## 2021-06-13 DIAGNOSIS — M47892 Other spondylosis, cervical region: Secondary | ICD-10-CM | POA: Diagnosis not present

## 2021-06-13 DIAGNOSIS — Z8051 Family history of malignant neoplasm of kidney: Secondary | ICD-10-CM | POA: Diagnosis not present

## 2021-06-13 DIAGNOSIS — M4802 Spinal stenosis, cervical region: Secondary | ICD-10-CM | POA: Diagnosis not present

## 2021-06-13 DIAGNOSIS — F17210 Nicotine dependence, cigarettes, uncomplicated: Secondary | ICD-10-CM | POA: Diagnosis not present

## 2021-06-13 NOTE — Progress Notes (Signed)
Patient alert and oriented, mae's well, voiding adequate amount of urine, swallowing without difficulty, no c/o pain at time of discharge. Patient discharged home with family. Script and discharged instructions given to patient. Patient and family stated understanding of instructions given. Patient has an appointment with Dr. Yates  

## 2021-06-13 NOTE — Progress Notes (Signed)
Patient ID: Haley Allen, female   DOB: 01-30-1981, 40 y.o.   MRN: [id number]   Subjective: 1 Day Post-Op Procedure(s) (LRB): C5-6, C6-7 ANTERIOR CERVICAL DISCECTOMY FUSION, ALLOGRAFT, PLATE (N/A) Patient reports pain as mild.  Incisional pain only.   Objective: Vital signs in last 24 hours: Temp:  [97 F (36.1 C)-98.5 F (36.9 C)] 98.5 F (36.9 C) (12/24 0711) Pulse Rate:  [84-104] 87 (12/24 0711) Resp:  [10-20] 18 (12/24 0711) BP: (123-155)/(70-95) 134/85 (12/24 0711) SpO2:  [93 %-98 %] 98 % (12/24 0711) FiO2 (%):  [21 %] 21 % (12/23 1030)  Intake/Output from previous day: 12/23 0701 - 12/24 0700 In: 1400 [I.V.:1300; IV Piggyback:100] Out: 170 [Drains:20; Blood:150] Intake/Output this shift: No intake/output data recorded.  Recent Labs    06/10/21 1111  HGB 14.0   Recent Labs    06/10/21 1111  WBC 11.6*  RBC 4.52  HCT 42.2  PLT 349   Recent Labs    06/10/21 1111  NA 136  K 4.4  CL 102  CO2 28  BUN 11  CREATININE 0.89  GLUCOSE 110*  CALCIUM 8.9   No results for input(s): LABPT, INR in the last 72 hours.  Neurologically intact DG Cervical Spine 2 or 3 views  Result Date: 06/12/2021 CLINICAL DATA:  ACDF. EXAM: CERVICAL SPINE - 2-3 VIEW COMPARISON:  None. FINDINGS: Multiple fluoroscopic images during ACDF at C5-C6 and C6-C7. Total fluoroscopic time was 22 seconds. Total fluoroscopic dose was 1.83 mGy. IMPRESSION: ACDF at C5-C6 and C6-C7 with intact hardware and disc spacer devices in place. Electronically Signed   By: Larose Hires D.O.   On: 06/12/2021 11:34   DG C-Arm 1-60 Min-No Report  Result Date: 06/12/2021 Fluoroscopy was utilized by the requesting physician.  No radiographic interpretation.   DG C-Arm 1-60 Min-No Report  Result Date: 06/12/2021 Fluoroscopy was utilized by the requesting physician.  No radiographic interpretation.    Assessment/Plan: 1 Day Post-Op Procedure(s) (LRB): C5-6, C6-7 ANTERIOR CERVICAL DISCECTOMY FUSION,  ALLOGRAFT, PLATE (N/A) Plan: discharge home. Office one week.  Eldred Manges 06/13/2021, 8:52 AM

## 2021-06-15 ENCOUNTER — Encounter (HOSPITAL_COMMUNITY): Payer: Self-pay | Admitting: Orthopaedic Surgery

## 2021-06-16 ENCOUNTER — Telehealth: Payer: Self-pay

## 2021-06-16 NOTE — Telephone Encounter (Signed)
Transition Care Management Follow-up Telephone Call Date of discharge and from where: 06/13/2021 from [facility] How have you been since you were released from the hospital? Pt stated that she is feeling better and did not have any questions or concerns at this time.  Any questions or concerns? No  Items Reviewed: Did the pt receive and understand the discharge instructions provided? Yes  Medications obtained and verified? Yes  Other? No  Any new allergies since your discharge? No  Dietary orders reviewed? No Do you have support at home? Yes   Functional Questionnaire: (I = Independent and D = Dependent) ADLs: I  Bathing/Dressing- I  Meal Prep- I  Eating- I  Maintaining continence- I  Transferring/Ambulation- I  Managing Meds- I   Follow up appointments reviewed:  PCP Hospital f/u appt confirmed? No   Specialist Hospital f/u appt confirmed? No Pt to call Ortho for one week follow up.  Are transportation arrangements needed? No  If their condition worsens, is the pt aware to call PCP or go to the Emergency Dept.? Yes Was the patient provided with contact information for the PCP's office or ED? Yes Was to pt encouraged to call back with questions or concerns? Yes

## 2021-06-18 ENCOUNTER — Ambulatory Visit (INDEPENDENT_AMBULATORY_CARE_PROVIDER_SITE_OTHER): Payer: BC Managed Care – PPO

## 2021-06-18 ENCOUNTER — Other Ambulatory Visit: Payer: Self-pay

## 2021-06-18 ENCOUNTER — Encounter: Payer: Self-pay | Admitting: Orthopaedic Surgery

## 2021-06-18 ENCOUNTER — Ambulatory Visit (INDEPENDENT_AMBULATORY_CARE_PROVIDER_SITE_OTHER): Payer: BC Managed Care – PPO | Admitting: Orthopaedic Surgery

## 2021-06-18 VITALS — Ht 68.0 in | Wt 182.0 lb

## 2021-06-18 DIAGNOSIS — Z981 Arthrodesis status: Secondary | ICD-10-CM | POA: Diagnosis not present

## 2021-06-18 NOTE — Progress Notes (Signed)
° °  Post-Op Visit Note   Patient: Haley Allen           Date of Birth: Nov 22, 1980           MRN: [id number] Visit Date: 06/18/2021 PCP: Bennie Pierini, FNP   Assessment & Plan: 1 week post two-level cervical fusion.  Incision looks good Steri-Strips changed.  Patient remains out of work.  Recheck 5 weeks and then we can discuss work resumption.  Chief Complaint:  Chief Complaint  Patient presents with   Neck - Routine Post Op    06/12/2021  C5-6, C6-7 ACDF   Visit Diagnoses:  1. Status post cervical spinal fusion     Plan: Return 5 weeks for lateral flexion-extension C-spine x-ray and AP x-ray cervical spine.  Follow-Up Instructions: Return in about 5 years (around 06/18/2026).   Orders:  Orders Placed This Encounter  Procedures   XR Cervical Spine 2 or 3 views   No orders of the defined types were placed in this encounter.   Imaging: No results found.  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   Synovitis of left knee 01/04/2019   Current every day smoker 06/30/2017   Past Medical History:  Diagnosis Date   Allergy     Family History  Problem Relation Age of Onset   Hypertension Mother    Diabetes Mother    Depression Mother    Cancer Father 50       renal   Renal cancer Father    Diabetes Maternal Grandmother    Leukemia Paternal Grandfather    Prostate cancer Neg Hx    Colon cancer Neg Hx     Past Surgical History:  Procedure Laterality Date   ANTERIOR CERVICAL DECOMP/DISCECTOMY FUSION N/A 06/12/2021   Procedure: C5-6, C6-7 ANTERIOR CERVICAL DISCECTOMY FUSION, ALLOGRAFT, PLATE;  Surgeon: Eldred Manges, MD;  Location: [facility] OR;  Service: Orthopedics;  Laterality: N/A;   WISDOM TOOTH EXTRACTION     Social History   Occupational History   Not on file  Tobacco Use   Smoking  status: Some Days    Packs/day: 0.50    Years: 10.00    Pack years: 5.00    Types: Cigarettes    Start date: 01/01/1998   Smokeless tobacco: Current  Vaping Use   Vaping Use: Never used  Substance and Sexual Activity   Alcohol use: Yes    Alcohol/week: 6.0 standard drinks    Types: 6 Cans of beer per week   Drug use: No   Sexual activity: Yes    Birth control/protection: I.U.D.

## 2021-06-23 NOTE — Discharge Summary (Signed)
Patient ID: Haley Allen MRN: [id number] DOB/AGE: [birth year] 41 y.o.  Admit date: 06/12/2021 Discharge date: 06/13/2021  Admission Diagnoses:  Principal Problem:   Cervical spinal stenosis Active Problems:   Other spondylosis with radiculopathy, cervical region   Discharge Diagnoses:  Principal Problem:   Cervical spinal stenosis Active Problems:   Other spondylosis with radiculopathy, cervical region  status post Procedure(s): C5-6, C6-7 ANTERIOR CERVICAL DISCECTOMY FUSION, ALLOGRAFT, PLATE  Past Medical History:  Diagnosis Date   Allergy     Surgeries: Procedure(s): C5-6, C6-7 ANTERIOR CERVICAL DISCECTOMY FUSION, ALLOGRAFT, PLATE on 66/29/4765   Consultants:   Discharged Condition: Improved  Hospital Course: Haley Allen is an 41 y.o. female who was admitted 06/12/2021 for operative treatment of Cervical spinal stenosis. Patient failed conservative treatments (please see the history and physical for the specifics) and had severe unremitting pain that affects sleep, daily activities and work/hobbies. After pre-op clearance, the patient was taken to the operating room on 06/12/2021 and underwent  Procedure(s): C5-6, C6-7 ANTERIOR CERVICAL DISCECTOMY FUSION, ALLOGRAFT, PLATE.    Patient was given perioperative antibiotics:  Anti-infectives (From admission, onward)    Start     Dose/Rate Route Frequency Ordered Stop   06/12/21 0600  ceFAZolin (ANCEF) IVPB 2g/100 mL premix        2 g 200 mL/hr over 30 Minutes Intravenous On call to O.R. 06/12/21 0541 06/12/21 0741        Patient was given sequential compression devices and early ambulation to prevent DVT.   Patient benefited maximally from hospital stay and there were no complications. At the time of discharge, the patient was urinating/moving their bowels without difficulty, tolerating a regular diet, pain is controlled with oral pain medications and they have been cleared by PT/OT.   Recent vital signs: No  data found.   Recent laboratory studies: No results for input(s): WBC, HGB, HCT, PLT, NA, K, CL, CO2, BUN, CREATININE, GLUCOSE, INR, CALCIUM in the last 72 hours.  Invalid input(s): PT, 2   Discharge Medications:   Allergies as of 06/13/2021       Reactions   Latex Other (See Comments)   Skin redness        Medication List     STOP taking these medications    cefdinir 300 MG capsule Commonly known as: OMNICEF   cyclobenzaprine 10 MG tablet Commonly known as: FLEXERIL   predniSONE 5 MG (21) Tbpk tablet Commonly known as: STERAPRED UNI-PAK 21 TAB       TAKE these medications    cetirizine 10 MG tablet Commonly known as: ZYRTEC Take 10 mg by mouth daily.   fluticasone 50 MCG/ACT nasal spray Commonly known as: FLONASE Place 2 sprays into both nostrils daily as needed for allergies or rhinitis.   furosemide 20 MG tablet Commonly known as: LASIX Take 1 tablet (20 mg total) by mouth daily. (NEEDS TO BE SEEN BEFORE NEXT REFILL)   methocarbamol 500 MG tablet Commonly known as: Robaxin Take 1 tablet (500 mg total) by mouth every 6 (six) hours as needed for muscle spasms.   oxyCODONE-acetaminophen 5-325 MG tablet Commonly known as: PERCOCET/ROXICET Take 1-2 tablets by mouth every 4 (four) hours as needed for severe pain.   solifenacin 5 MG tablet Commonly known as: VESICARE Take 5 mg by mouth at bedtime.        Diagnostic Studies: DG Cervical Spine 2 or 3 views  Result Date: 06/12/2021 CLINICAL DATA:  ACDF. EXAM: CERVICAL SPINE - 2-3 VIEW COMPARISON:  None. FINDINGS:  Multiple fluoroscopic images during ACDF at C5-C6 and C6-C7. Total fluoroscopic time was 22 seconds. Total fluoroscopic dose was 1.83 mGy. IMPRESSION: ACDF at C5-C6 and C6-C7 with intact hardware and disc spacer devices in place. Electronically Signed   By: Larose Hires D.O.   On: 06/12/2021 11:34   DG C-Arm 1-60 Min-No Report  Result Date: 06/12/2021 Fluoroscopy was utilized by the requesting  physician.  No radiographic interpretation.   DG C-Arm 1-60 Min-No Report  Result Date: 06/12/2021 Fluoroscopy was utilized by the requesting physician.  No radiographic interpretation.   XR Cervical Spine 2 or 3 views  Result Date: 06/18/2021 AP lateral cervical spine images obtained this demonstrates two-level cervical fusion C5-6, C6-7 with good position.  Minimal prevertebral swelling postop. Impression: Satisfactory two-level cervical fusion C5-C7.   Discharge Instructions     Incentive spirometry RT   Complete by: As directed         Follow-up Information     Eldred Manges, MD. Schedule an appointment as soon as possible for a visit today.   Specialty: Orthopedic Surgery Why: need return office visit one week postop.  call to schedule. Contact information: [address] [place] [address] [phone]                 Discharge Plan:  discharge to home  Disposition:     Signed: Zonia Kief  06/23/2021, 9:40 AM

## 2021-06-24 ENCOUNTER — Other Ambulatory Visit: Payer: Self-pay | Admitting: Surgery

## 2021-06-25 ENCOUNTER — Telehealth: Payer: Self-pay | Admitting: Radiology

## 2021-06-25 NOTE — Telephone Encounter (Signed)
Patient requests work note be faxed to [employer] at [phone].  CB for patient is [phone]

## 2021-06-25 NOTE — Telephone Encounter (Signed)
Note entered and faxed per patient request.

## 2021-07-07 ENCOUNTER — Other Ambulatory Visit: Payer: Self-pay | Admitting: Orthopaedic Surgery

## 2021-07-23 ENCOUNTER — Ambulatory Visit (INDEPENDENT_AMBULATORY_CARE_PROVIDER_SITE_OTHER): Payer: BC Managed Care – PPO | Admitting: Orthopaedic Surgery

## 2021-07-23 ENCOUNTER — Encounter: Payer: Self-pay | Admitting: Orthopaedic Surgery

## 2021-07-23 ENCOUNTER — Other Ambulatory Visit: Payer: Self-pay

## 2021-07-23 ENCOUNTER — Ambulatory Visit: Payer: Self-pay

## 2021-07-23 VITALS — Ht 68.0 in | Wt 182.0 lb

## 2021-07-23 DIAGNOSIS — Z981 Arthrodesis status: Secondary | ICD-10-CM

## 2021-07-23 NOTE — Progress Notes (Signed)
° °  Post-Op Visit Note   Patient: Haley Allen           Date of Birth: Mar 31, 1981           MRN: [id number] Visit Date: 07/23/2021 PCP: Chevis Pretty, FNP   Assessment & Plan: Postop two-level cervical fusion 06/12/2021.  Good relief of preop symptoms.  Vision looks good.  She is released for work on 07/27/2021.  Chief Complaint:  Chief Complaint  Patient presents with   Neck - Routine Post Op, Follow-up    06/12/2021 C5-6. C6-7 ACDF   Left Knee - Pain   Visit Diagnoses:  1. Status post cervical spinal fusion     Plan: Return as needed pain patient is happy with the surgical result.  Follow-Up Instructions: No follow-ups on file.   Orders:  Orders Placed This Encounter  Procedures   XR Cervical Spine 2 or 3 views   No orders of the defined types were placed in this encounter.   Imaging: No results found.  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   Synovitis of left knee 01/04/2019   Current every day smoker 06/30/2017   Past Medical History:  Diagnosis Date   Allergy     Family History  Problem Relation Age of Onset   Hypertension Mother    Diabetes Mother    Depression Mother    Cancer Father 42       renal   Renal cancer Father    Diabetes Maternal Grandmother    Leukemia Paternal Grandfather    Prostate cancer Neg Hx    Colon cancer Neg Hx     Past Surgical History:  Procedure Laterality Date   ANTERIOR CERVICAL DECOMP/DISCECTOMY FUSION N/A 06/12/2021   Procedure: C5-6, C6-7 ANTERIOR CERVICAL DISCECTOMY FUSION, ALLOGRAFT, PLATE;  Surgeon: Marybelle Killings, MD;  Location: [facility];  Service: Orthopedics;  Laterality: N/A;   WISDOM TOOTH EXTRACTION     Social History   Occupational History   Not on file  Tobacco Use   Smoking status: Some Days    Packs/day: 0.50    Years:  10.00    Pack years: 5.00    Types: Cigarettes    Start date: 01/01/1998   Smokeless tobacco: Current  Vaping Use   Vaping Use: Never used  Substance and Sexual Activity   Alcohol use: Yes    Alcohol/week: 6.0 standard drinks    Types: 6 Cans of beer per week   Drug use: No   Sexual activity: Yes    Birth control/protection: I.U.D.

## 2021-07-24 ENCOUNTER — Other Ambulatory Visit: Payer: Self-pay | Admitting: Orthopaedic Surgery

## 2021-07-26 DIAGNOSIS — S60011A Contusion of right thumb without damage to nail, initial encounter: Secondary | ICD-10-CM | POA: Diagnosis not present

## 2021-07-26 DIAGNOSIS — W1830XA Fall on same level, unspecified, initial encounter: Secondary | ICD-10-CM | POA: Diagnosis not present

## 2021-07-26 DIAGNOSIS — S63601A Unspecified sprain of right thumb, initial encounter: Secondary | ICD-10-CM | POA: Diagnosis not present

## 2021-07-26 DIAGNOSIS — R296 Repeated falls: Secondary | ICD-10-CM | POA: Diagnosis not present

## 2021-07-26 DIAGNOSIS — S60211A Contusion of right wrist, initial encounter: Secondary | ICD-10-CM | POA: Diagnosis not present

## 2021-07-26 DIAGNOSIS — Y939 Activity, unspecified: Secondary | ICD-10-CM | POA: Diagnosis not present

## 2021-07-26 DIAGNOSIS — Z720 Tobacco use: Secondary | ICD-10-CM | POA: Diagnosis not present

## 2021-07-26 DIAGNOSIS — M25531 Pain in right wrist: Secondary | ICD-10-CM | POA: Diagnosis not present

## 2021-07-26 DIAGNOSIS — M79644 Pain in right finger(s): Secondary | ICD-10-CM | POA: Diagnosis not present

## 2021-07-26 DIAGNOSIS — Y929 Unspecified place or not applicable: Secondary | ICD-10-CM | POA: Diagnosis not present

## 2021-08-02 ENCOUNTER — Other Ambulatory Visit: Payer: Self-pay | Admitting: Nurse Practitioner

## 2021-08-02 DIAGNOSIS — R609 Edema, unspecified: Secondary | ICD-10-CM

## 2021-08-03 MED ORDER — FUROSEMIDE 20 MG PO TABS: 20.0000 mg | ORAL_TABLET | Freq: Every day | ORAL | 0 refills | Status: DC

## 2021-08-03 NOTE — Telephone Encounter (Signed)
Apt scheduled 08/06/2021

## 2021-08-03 NOTE — Addendum Note (Signed)
Addended by: Julious Payer D on: 08/03/2021 01:27 PM   Modules accepted: Orders

## 2021-08-03 NOTE — Telephone Encounter (Signed)
MMM NTBS 30 days given 06/01/21

## 2021-08-06 ENCOUNTER — Encounter: Payer: Self-pay | Admitting: Nurse Practitioner

## 2021-08-06 ENCOUNTER — Ambulatory Visit (INDEPENDENT_AMBULATORY_CARE_PROVIDER_SITE_OTHER): Payer: BC Managed Care – PPO | Admitting: Nurse Practitioner

## 2021-08-06 VITALS — BP 118/88 | HR 102 | Temp 98.8°F | Resp 20 | Ht 68.0 in | Wt 191.0 lb

## 2021-08-06 DIAGNOSIS — M4802 Spinal stenosis, cervical region: Secondary | ICD-10-CM | POA: Diagnosis not present

## 2021-08-06 DIAGNOSIS — E041 Nontoxic single thyroid nodule: Secondary | ICD-10-CM | POA: Diagnosis not present

## 2021-08-06 DIAGNOSIS — R609 Edema, unspecified: Secondary | ICD-10-CM

## 2021-08-06 DIAGNOSIS — F17200 Nicotine dependence, unspecified, uncomplicated: Secondary | ICD-10-CM

## 2021-08-06 DIAGNOSIS — R3915 Urgency of urination: Secondary | ICD-10-CM | POA: Diagnosis not present

## 2021-08-06 DIAGNOSIS — J3089 Other allergic rhinitis: Secondary | ICD-10-CM

## 2021-08-06 LAB — CMP14+EGFR
ALT: 14 IU/L (ref 0–32)
AST: 21 IU/L (ref 0–40)
Albumin/Globulin Ratio: 1.7 (ref 1.2–2.2)
Albumin: 4.8 g/dL (ref 3.8–4.8)
Alkaline Phosphatase: 80 IU/L (ref 44–121)
BUN/Creatinine Ratio: 15 (ref 9–23)
BUN: 12 mg/dL (ref 6–24)
Bilirubin Total: 0.4 mg/dL (ref 0.0–1.2)
CO2: 27 mmol/L (ref 20–29)
Calcium: 10.1 mg/dL (ref 8.7–10.2)
Chloride: 97 mmol/L (ref 96–106)
Creatinine, Ser: 0.81 mg/dL (ref 0.57–1.00)
Globulin, Total: 2.8 g/dL (ref 1.5–4.5)
Glucose: 76 mg/dL (ref 70–99)
Potassium: 4.6 mmol/L (ref 3.5–5.2)
Sodium: 139 mmol/L (ref 134–144)
Total Protein: 7.6 g/dL (ref 6.0–8.5)
eGFR: 93 mL/min/{1.73_m2} (ref >59)

## 2021-08-06 LAB — CBC WITH DIFFERENTIAL/PLATELET
Basophils Absolute: 0.1 10*3/uL (ref 0.0–0.2)
Basos: 1 %
EOS (ABSOLUTE): 0.4 10*3/uL (ref 0.0–0.4)
Eos: 5 %
Hematocrit: 44.3 % (ref 34.0–46.6)
Hemoglobin: 14.8 g/dL (ref 11.1–15.9)
Immature Grans (Abs): 0.1 10*3/uL (ref 0.0–0.1)
Immature Granulocytes: 1 %
Lymphocytes Absolute: 2.3 10*3/uL (ref 0.7–3.1)
Lymphs: 29 %
MCH: 31.2 pg (ref 26.6–33.0)
MCHC: 33.4 g/dL (ref 31.5–35.7)
MCV: 93 fL (ref 79–97)
Monocytes Absolute: 0.8 10*3/uL (ref 0.1–0.9)
Monocytes: 9 %
Neutrophils Absolute: 4.5 10*3/uL (ref 1.4–7.0)
Neutrophils: 55 %
Platelets: 369 10*3/uL (ref 150–450)
RBC: 4.75 x10E6/uL (ref 3.77–5.28)
RDW: 13 % (ref 11.7–15.4)
WBC: 8.2 10*3/uL (ref 3.4–10.8)

## 2021-08-06 LAB — LIPID PANEL
Chol/HDL Ratio: 4.5 ratio — ABNORMAL HIGH (ref 0.0–4.4)
Cholesterol, Total: 231 mg/dL — ABNORMAL HIGH (ref 100–199)
HDL: 51 mg/dL (ref >39)
LDL Chol Calc (NIH): 159 mg/dL — ABNORMAL HIGH (ref 0–99)
Triglycerides: 118 mg/dL (ref 0–149)
VLDL Cholesterol Cal: 21 mg/dL (ref 5–40)

## 2021-08-06 MED ORDER — CETIRIZINE HCL 10 MG PO TABS: 10.0000 mg | ORAL_TABLET | Freq: Every day | ORAL | 1 refills | Status: DC

## 2021-08-06 MED ORDER — FUROSEMIDE 20 MG PO TABS: 20.0000 mg | ORAL_TABLET | Freq: Every day | ORAL | 1 refills | Status: DC

## 2021-08-06 MED ORDER — FLUTICASONE PROPIONATE 50 MCG/ACT NA SUSP: 2.0000 | Freq: Every day | NASAL | 5 refills | Status: DC | PRN

## 2021-08-06 MED ORDER — SOLIFENACIN SUCCINATE 5 MG PO TABS: 5.0000 mg | ORAL_TABLET | Freq: Every day | ORAL | 1 refills | Status: DC

## 2021-08-06 NOTE — Patient Instructions (Signed)
Exercising to Stay Healthy °To become healthy and stay healthy, it is recommended that you do moderate-intensity and vigorous-intensity exercise. You can tell that you are exercising at a moderate intensity if your heart starts beating faster and you start breathing faster but can still hold a conversation. You can tell that you are exercising at a vigorous intensity if you are breathing much harder and faster and cannot hold a conversation while exercising. °How can exercise benefit me? °Exercising regularly is important. It has many health benefits, such as: °Improving overall fitness, flexibility, and endurance. °Increasing bone density. °Helping with weight control. °Decreasing body fat. °Increasing muscle strength and endurance. °Reducing stress and tension, anxiety, depression, or anger. °Improving overall health. °What guidelines should I follow while exercising? °Before you start a new exercise program, talk with your health care provider. °Do not exercise so much that you hurt yourself, feel dizzy, or get very short of breath. °Wear comfortable clothes and wear shoes with good support. °Drink plenty of water while you exercise to prevent dehydration or heat stroke. °Work out until your breathing and your heartbeat get faster (moderate intensity). °How often should I exercise? °Choose an activity that you enjoy, and set realistic goals. Your health care provider can help you make an activity plan that is individually designed and works best for you. °Exercise regularly as told by your health care provider. This may include: °Doing strength training two times a week, such as: °Lifting weights. °Using resistance bands. °Push-ups. °Sit-ups. °Yoga. °Doing a certain intensity of exercise for a given amount of time. Choose from these options: °A total of 150 minutes of moderate-intensity exercise every week. °A total of 75 minutes of vigorous-intensity exercise every week. °A mix of moderate-intensity and  vigorous-intensity exercise every week. °Children, pregnant women, people who have not exercised regularly, people who are overweight, and older adults may need to talk with a health care provider about what activities are safe to perform. If you have a medical condition, be sure to talk with your health care provider before you start a new exercise program. °What are some exercise ideas? °Moderate-intensity exercise ideas include: °Walking 1 mile (1.6 km) in about 15 minutes. °Biking. °Hiking. °Golfing. °Dancing. °Water aerobics. °Vigorous-intensity exercise ideas include: °Walking 4.5 miles (7.2 km) or more in about 1 hour. °Jogging or running 5 miles (8 km) in about 1 hour. °Biking 10 miles (16.1 km) or more in about 1 hour. °Lap swimming. °Roller-skating or in-line skating. °Cross-country skiing. °Vigorous competitive sports, such as football, basketball, and soccer. °Jumping rope. °Aerobic dancing. °What are some everyday activities that can help me get exercise? °Yard work, such as: °Pushing a lawn mower. °Raking and bagging leaves. °Washing your car. °Pushing a stroller. °Shoveling snow. °Gardening. °Washing windows or floors. °How can I be more active in my day-to-day activities? °Use stairs instead of an elevator. °Take a walk during your lunch break. °If you drive, park your car farther away from your work or school. °If you take public transportation, get off one stop early and walk the rest of the way. °Stand up or walk around during all of your indoor phone calls. °Get up, stretch, and walk around every 30 minutes throughout the day. °Enjoy exercise with a friend. Support to continue exercising will help you keep a regular routine of activity. °Where to find more information °You can find more information about exercising to stay healthy from: °U.S. Department of Health and Human Services: www.hhs.gov °Centers for Disease Control and Prevention (  CDC): www.cdc.gov °Summary °Exercising regularly is  important. It will improve your overall fitness, flexibility, and endurance. °Regular exercise will also improve your overall health. It can help you control your weight, reduce stress, and improve your bone density. °Do not exercise so much that you hurt yourself, feel dizzy, or get very short of breath. °Before you start a new exercise program, talk with your health care provider. °This information is not intended to replace advice given to you by your health care provider. Make sure you discuss any questions you have with your health care provider. °Document Revised: 10/03/2020 Document Reviewed: 10/03/2020 °Elsevier Patient Education © 2022 [employer] ° °

## 2021-08-06 NOTE — Progress Notes (Signed)
Subjective:    Patient ID: Haley Allen, female    DOB: June 30, 1980, 41 y.o.   MRN: [id number]   Chief Complaint: No chief complaint on file.    HPI:  Haley Allen is a 41 y.o. who identifies as a female who was assigned female at birth.   Social history: Lives with: husband and son Work history: works for school system   Comes in today for follow up of the following chronic medical issues:  1. Cervical spinal stenosis  Had neck surgery 06/12/21 and is doing well. Recently went back to work.  2. Current every day smoker Smokes around I pack a day. Not old enough to do CT scan as of yet.  3. Urinary urgency Is on vesicare and that helps her from having to void so much.  4. Peripheral edema Only has occasionally in ankles. Hands still swell at times. Is on lasix daily.  5. Non-seasonal allergic rhinitis, unspecified trigger Uses flonase daily and zyrtec on occasion.   New complaints: None today  Allergies  Allergen Reactions   Latex Other (See Comments)    Skin redness   Outpatient Encounter Medications as of 08/06/2021  Medication Sig   cetirizine (ZYRTEC) 10 MG tablet Take 10 mg by mouth daily.   fluticasone (FLONASE) 50 MCG/ACT nasal spray Place 2 sprays into both nostrils daily as needed for allergies or rhinitis.   furosemide (LASIX) 20 MG tablet Take 1 tablet (20 mg total) by mouth daily.   methocarbamol (ROBAXIN) 500 MG tablet TAKE 1 TABLET BY MOUTH EVERY 6 HOURS AS NEEDED FOR MUSCLE SPASMS.   oxyCODONE-acetaminophen (PERCOCET/ROXICET) 5-325 MG tablet Take 1-2 tablets by mouth every 4 (four) hours as needed for severe pain.   solifenacin (VESICARE) 5 MG tablet Take 5 mg by mouth at bedtime.   No facility-administered encounter medications on file as of 08/06/2021.    Past Surgical History:  Procedure Laterality Date   ANTERIOR CERVICAL DECOMP/DISCECTOMY FUSION N/A 06/12/2021   Procedure: C5-6, C6-7 ANTERIOR CERVICAL DISCECTOMY FUSION, ALLOGRAFT,  PLATE;  Surgeon: Marybelle Killings, MD;  Location: [facility] [place];  Service: Orthopedics;  Laterality: N/A;   WISDOM TOOTH EXTRACTION      Family History  Problem Relation Age of Onset   Hypertension Mother    Diabetes Mother    Depression Mother    Cancer Father 61       renal   Renal cancer Father    Diabetes Maternal Grandmother    Leukemia Paternal Grandfather    Prostate cancer Neg Hx    Colon cancer Neg Hx       Controlled substance contract: n/a     Review of Systems  Constitutional:  Negative for diaphoresis.  Eyes:  Negative for pain.  Respiratory:  Negative for shortness of breath.   Cardiovascular:  Negative for chest pain, palpitations and leg swelling.  Gastrointestinal:  Negative for abdominal pain.  Endocrine: Negative for polydipsia.  Skin:  Negative for rash.  Neurological:  Negative for dizziness, weakness and headaches.  Hematological:  Does not bruise/bleed easily.  All other systems reviewed and are negative.     Objective:   Physical Exam Vitals and nursing note reviewed.  Constitutional:      General: She is not in acute distress.    Appearance: Normal appearance. She is well-developed.  HENT:     Head: Normocephalic.     Right Ear: Tympanic membrane normal.     Left Ear: Tympanic membrane normal.  Nose: Nose normal.     Mouth/Throat:     Mouth: Mucous membranes are moist.  Eyes:     Pupils: Pupils are equal, round, and reactive to light.  Neck:     Vascular: No carotid bruit or JVD.  Cardiovascular:     Rate and Rhythm: Normal rate and regular rhythm.     Heart sounds: Normal heart sounds.  Pulmonary:     Effort: Pulmonary effort is normal. No respiratory distress.     Breath sounds: Normal breath sounds. No wheezing or rales.  Chest:     Chest wall: No tenderness.  Abdominal:     General: Bowel sounds are normal. There is no distension or abdominal bruit.     Palpations: Abdomen is soft. There is no hepatomegaly, splenomegaly, mass or  pulsatile mass.     Tenderness: There is no abdominal tenderness.  Musculoskeletal:        General: Normal range of motion.     Cervical back: Normal range of motion and neck supple.  Lymphadenopathy:     Cervical: No cervical adenopathy.  Skin:    General: Skin is warm and dry.  Neurological:     Mental Status: She is alert and oriented to person, place, and time.     Deep Tendon Reflexes: Reflexes are normal and symmetric.  Psychiatric:        Behavior: Behavior normal.        Thought Content: Thought content normal.        Judgment: Judgment normal.    BP 118/88    Pulse (!) 102    Temp 98.8 F (37.1 C) (Temporal)    Resp 20    Ht _0  (1.727 m)    Wt 191 lb (86.6 kg)    SpO2 98%    BMI 29.04 kg/m        Assessment & Plan:  Haley Allen comes in today with chief complaint of Medical Management of Chronic Issues   Diagnosis and orders addressed:  1. Cervical spinal stenosis Keep follow up with neuro surgeon  2. Current every day smoker Smoking cessation encouraged  3. Urinary urgency Force fluids - solifenacin (VESICARE) 5 MG tablet; Take 1 tablet (5 mg total) by mouth at bedtime.  Dispense: 90 tablet; Refill: 1  4. Peripheral edema Elevate legs when sitting - furosemide (LASIX) 20 MG tablet; Take 1 tablet (20 mg total) by mouth daily.  Dispense: 90 tablet; Refill: 1 - CBC with Differential/Platelet - CMP14+EGFR - Lipid panel  5. Non-seasonal allergic rhinitis, unspecified trigger - fluticasone (FLONASE) 50 MCG/ACT nasal spray; Place 2 sprays into both nostrils daily as needed for allergies or rhinitis.  Dispense: 16 g; Refill: 5 - cetirizine (ZYRTEC) 10 MG tablet; Take 1 tablet (10 mg total) by mouth daily.  Dispense: 90 tablet; Refill: 1   Labs pending Health Maintenance reviewed Diet and exercise encouraged  Follow up plan: 6 months   [name] Hassell Done, FNP

## 2021-08-11 ENCOUNTER — Other Ambulatory Visit: Payer: Self-pay | Admitting: Nurse Practitioner

## 2021-08-11 NOTE — Progress Notes (Deleted)
° °  Subjective:    Patient ID: Haley Allen, female    DOB: 1981/03/29, 41 y.o.   MRN: [id number]   Chief Complaint: No chief complaint on file.    HPI:  Haley Allen is a 41 y.o. who identifies as a female who was assigned female at birth.   Social history: Lives with: *** Work history: ***   Comes in today for follow up of the following chronic medical issues:  There are no diagnoses linked to this encounter.  New complaints: ***  Allergies  Allergen Reactions   Latex Other (See Comments)    Skin redness   Outpatient Encounter Medications as of 08/11/2021  Medication Sig   cetirizine (ZYRTEC) 10 MG tablet Take 1 tablet (10 mg total) by mouth daily.   fluticasone (FLONASE) 50 MCG/ACT nasal spray Place 2 sprays into both nostrils daily as needed for allergies or rhinitis.   furosemide (LASIX) 20 MG tablet Take 1 tablet (20 mg total) by mouth daily.   solifenacin (VESICARE) 5 MG tablet Take 1 tablet (5 mg total) by mouth at bedtime.   No facility-administered encounter medications on file as of 08/11/2021.    Past Surgical History:  Procedure Laterality Date   ANTERIOR CERVICAL DECOMP/DISCECTOMY FUSION N/A 06/12/2021   Procedure: C5-6, C6-7 ANTERIOR CERVICAL DISCECTOMY FUSION, ALLOGRAFT, PLATE;  Surgeon: Eldred Manges, MD;  Location: [facility];  Service: Orthopedics;  Laterality: N/A;   WISDOM TOOTH EXTRACTION      Family History  Problem Relation Age of Onset   Hypertension Mother    Diabetes Mother    Depression Mother    Cancer Father 33       renal   Renal cancer Father    Diabetes Maternal Grandmother    Leukemia Paternal Grandfather    Prostate cancer Neg Hx    Colon cancer Neg Hx       Controlled substance contract: ***     Review of Systems     Objective:   Physical Exam        Assessment & Plan:

## 2021-08-13 DIAGNOSIS — Z6829 Body mass index (BMI) 29.0-29.9, adult: Secondary | ICD-10-CM | POA: Diagnosis not present

## 2021-08-13 DIAGNOSIS — R0602 Shortness of breath: Secondary | ICD-10-CM | POA: Diagnosis not present

## 2021-08-13 DIAGNOSIS — E7800 Pure hypercholesterolemia, unspecified: Secondary | ICD-10-CM | POA: Diagnosis not present

## 2021-08-13 LAB — THYROID PANEL WITH TSH
Free Thyroxine Index: 1.8 (ref 1.2–4.9)
T3 Uptake Ratio: 27 % (ref 24–39)
T4, Total: 6.8 ug/dL (ref 4.5–12.0)
TSH: 3.35 u[IU]/mL (ref 0.450–4.500)

## 2021-08-13 LAB — SPECIMEN STATUS REPORT

## 2021-09-01 ENCOUNTER — Ambulatory Visit (INDEPENDENT_AMBULATORY_CARE_PROVIDER_SITE_OTHER): Payer: BC Managed Care – PPO

## 2021-09-01 ENCOUNTER — Ambulatory Visit (INDEPENDENT_AMBULATORY_CARE_PROVIDER_SITE_OTHER): Payer: BC Managed Care – PPO | Admitting: Nurse Practitioner

## 2021-09-01 ENCOUNTER — Encounter: Payer: Self-pay | Admitting: Nurse Practitioner

## 2021-09-01 VITALS — BP 121/80 | HR 86 | Temp 98.0°F | Resp 20 | Ht 68.0 in | Wt 187.0 lb

## 2021-09-01 DIAGNOSIS — M778 Other enthesopathies, not elsewhere classified: Secondary | ICD-10-CM | POA: Diagnosis not present

## 2021-09-01 DIAGNOSIS — M79644 Pain in right finger(s): Secondary | ICD-10-CM

## 2021-09-01 MED ORDER — PREDNISONE 10 MG (21) PO TBPK: ORAL_TABLET | ORAL | 0 refills | Status: DC

## 2021-09-01 NOTE — Patient Instructions (Signed)
Tendinitis °Tendinitis is irritation and swelling (inflammation) of a tendon. A tendon is a cord of tissue that connects muscle to bone. Tendinitis is most common in the shoulder, ankle, elbow, or wrist. °What are the causes? °Using a tendon or muscle too much (overuse). This is the most common cause. °Wear and tear that happens as you age. °Injury. °Some medical conditions, such as arthritis. °Some medicines. °What increases the risk? °You are more likely to get this condition if you do activities that involve the same movements over and over again (repetitive motions). °What are the signs or symptoms? °Pain. °Tenderness. °Mild swelling. °Decreased range of motion. °How is this treated? °This condition is usually treated with RICE therapy. RICE stands for: °Rest. °Ice. °Compression. This means putting pressure on the affected area. °Elevation. This means raising the affected area above the level of your heart. °Treatment may also include: °Medicines for swelling or pain. °Exercises or physical therapy to help your tendon move better and get stronger. °A brace or splint. °A shot (injection) of a type of medicine called corticosteroid. °Surgery. This is rarely needed. °Follow these instructions at home: °If you have a splint or brace that can be taken off: °Wear the splint or brace as told by your doctor. Take it off only as told by your doctor. °Check the skin around the splint or brace every day. Tell your doctor if you see problems. °Loosen the splint or brace if your fingers or toes: °Tingle. °Become numb. °Turn cold and blue. °Keep the splint or brace clean. °If the splint or brace is not waterproof: °Do not let it get wet. °Cover it with a watertight covering when you take a bath or shower, or take it off as told by your doctor. °Managing pain, stiffness, and swelling °  °If told, put ice on the affected area. To do this: °If you have a removable splint or brace, take it off as told by your doctor. °Put ice in  a plastic bag. °Place a towel between your skin and the bag. °Leave the ice on for 20 minutes, 2-3 times a day. °Take off the ice if your skin turns bright red. This is very important. If you cannot feel pain, heat, or cold, you have a greater risk of damage to the area. °Move the fingers or toes of the affected arm or leg often, if this applies. °If told, raise the affected area above the level of your heart while you are sitting or lying down. °If told, put heat on the affected area before you exercise. Use the heat source that your doctor recommends, such as a moist heat pack or a heating pad. °Place a towel between your skin and the heat source. °Leave the heat on for 20-30 minutes. °Take off the heat if your skin turns bright red. This is very important. If you cannot feel pain, heat, or cold, you have a greater risk of getting burned. °Activity °Rest the affected area as told by your doctor. °Ask your doctor when it is safe to drive if you have a splint or brace on any part of your arm or leg. °Return to your normal activities when your doctor says that it is safe. °Avoid using the affected area while you have symptoms. °Do exercises as told by your doctor. °General instructions °Wear an elastic bandage or pressure (compression) wrap only as told by your doctor. °Take over-the-counter and prescription medicines only as told by your doctor. °Keep all follow-up visits. °Contact a   doctor if: °You do not get better. °You get new problems, such as numbness in your hands or feet, and you do not know why. °Summary °Tendinitis is irritation and swelling (inflammation) of a tendon. °You are more likely to get this condition if you do activities that involve the same movements over and over again. °This condition is usually treated with RICE therapy. RICE stands for rest, ice, compression, and elevate. °Avoid using the affected area while you have symptoms. °This information is not intended to replace advice given to  you by your health care provider. Make sure you discuss any questions you have with your health care provider. °Document Revised: 02/12/2021 Document Reviewed: 02/12/2021 °Elsevier Patient Education © 2022 [employer] ° °

## 2021-09-01 NOTE — Progress Notes (Signed)
? ?  Subjective:  ? ? Patient ID: Haley Allen, female    DOB: [birth year], 41 y.o.   MRN: [id number] ? ? ?Chief Complaint: Right thumb pain ? ? ?HPI ?Patient comes in today c/o right thumb pain. She as been wearing a thumb spica splint. And is not getting any better.she inured it in February and went to [facility] hospital and was told was not broken. ? ? ? ?Review of Systems  ?Constitutional:  Negative for diaphoresis.  ?Eyes:  Negative for pain.  ?Respiratory:  Negative for shortness of breath.   ?Cardiovascular:  Negative for chest pain, palpitations and leg swelling.  ?Gastrointestinal:  Negative for abdominal pain.  ?Endocrine: Negative for polydipsia.  ?Musculoskeletal:  Positive for arthralgias (right thumb).  ?Skin:  Negative for rash.  ?Neurological:  Negative for dizziness, weakness and headaches.  ?Hematological:  Does not bruise/bleed easily.  ?All other systems reviewed and are negative. ? ?   ?Objective:  ? Physical Exam ?Constitutional:   ?   Appearance: Normal appearance.  ?Cardiovascular:  ?   Rate and Rhythm: Normal rate and regular rhythm.  ?   Heart sounds: Normal heart sounds.  ?Pulmonary:  ?   Effort: Pulmonary effort is normal.  ?   Breath sounds: Normal breath sounds.  ?Musculoskeletal:  ?   Comments: decrease rom of flexion of right thumb. ?No pain with extension or opposition of other fingers. ?MIP joint of right thumb edematous  ?Skin: ?   General: Skin is warm.  ?Neurological:  ?   General: No focal deficit present.  ?   Mental Status: She is alert and oriented to person, place, and time.  ?Psychiatric:     ?   Mood and Affect: Mood normal.     ?   Behavior: Behavior normal.  ? ?BP 121/80   Pulse 86   Temp 98 ?F (36.7 ?C) (Temporal)   Resp 20   Ht 5\' 8"  (1.727 m)   Wt 187 lb (84.8 kg)   SpO2 98%   BMI 28.43 kg/m?  ? ? ? ? ?   ?Assessment & Plan:  ?Haley Allen in today with chief complaint of Right thumb pain ? ? ?1. Pain of right thumb ?- DG Hand Complete Right ? ?2. Thumb  tendonitis ?Ice bid ?Rest ?Wear thumb spica splint nonstop except for when icing or taking a shower ?- predniSONE (STERAPRED UNI-PAK 21 TAB) 10 MG (21) TBPK tablet; As directed x 6 days  Dispense: 21 tablet; Refill: 0 ? ? ? ?The above assessment and management plan was discussed with the patient. The patient verbalized understanding of and has agreed to the management plan. Patient is aware to call the clinic if symptoms persist or worsen. Patient is aware when to return to the clinic for a follow-up visit. Patient educated on when it is appropriate to go to the emergency department.  ? ?[name] Arnette Felts, FNP ? ? ? ?

## 2021-09-10 DIAGNOSIS — E7800 Pure hypercholesterolemia, unspecified: Secondary | ICD-10-CM | POA: Diagnosis not present

## 2021-09-10 DIAGNOSIS — Z6829 Body mass index (BMI) 29.0-29.9, adult: Secondary | ICD-10-CM | POA: Diagnosis not present

## 2021-09-29 DIAGNOSIS — Z1231 Encounter for screening mammogram for malignant neoplasm of breast: Secondary | ICD-10-CM | POA: Diagnosis not present

## 2021-10-06 DIAGNOSIS — Z6829 Body mass index (BMI) 29.0-29.9, adult: Secondary | ICD-10-CM | POA: Diagnosis not present

## 2021-10-06 DIAGNOSIS — E7800 Pure hypercholesterolemia, unspecified: Secondary | ICD-10-CM | POA: Diagnosis not present

## 2021-10-15 ENCOUNTER — Ambulatory Visit (INDEPENDENT_AMBULATORY_CARE_PROVIDER_SITE_OTHER): Payer: BC Managed Care – PPO | Admitting: Orthopaedic Surgery

## 2021-10-15 ENCOUNTER — Encounter: Payer: Self-pay | Admitting: Orthopaedic Surgery

## 2021-10-15 DIAGNOSIS — S63641A Sprain of metacarpophalangeal joint of right thumb, initial encounter: Secondary | ICD-10-CM

## 2021-10-15 NOTE — Progress Notes (Signed)
? ?Office Visit Note ?  ?Patient: Haley Allen           ?Date of Birth: 08/22/1980           ?MRN: [id number] ?Visit Date: 10/15/2021 ?             ?Requested by: Chevis Pretty, FNP ?[address] ?[place],  [place] [address] ?PCP: Chevis Pretty, FNP ? ? ?Assessment & Plan: ?Visit Diagnoses:  ?1. Gamekeeper's thumb of right hand, initial encounter   ? ? ?Plan: We will have her see Dr. Tempie Donning.  She may have a Stener's lesion and is continuing problems with use of her hand and right thumb. ? ?Follow-Up Instructions: No follow-ups on file.  ? ?Orders:  ?No orders of the defined types were placed in this encounter. ? ?No orders of the defined types were placed in this encounter. ? ? ? ? Procedures: ?No procedures performed ? ? ?Clinical Data: ?No additional findings. ? ? ?Subjective: ?Chief Complaint  ?Patient presents with  ? Right Hand - Injury  ?  Right thumb pain and swelling.  ? ? ?HPI 41 year old female had 2 level cervical fusion by me December 2022 have been have a fall in February 2 months ago injuring her right dominant thumb.  Since that time she has had difficulty flexing her thumb is noticed pain and swelling at the MP joint and problems with gripping.  She is placed on a prednisone Dosepak by her PCP it helped for a while then had some recurrence.  She was in a thumb spica.  She continues to have pain and swelling.  Patient had x-rays at [facility] family medicine which were negative on 09/01/2021. ? ?Review of Systems all the systems noncontributory to HPI. ? ? ?Objective: ?Vital Signs: Ht 5\' 8"  (1.727 m)   Wt 180 lb (81.6 kg)   BMI 27.37 kg/m?  ? ?Physical Exam ?Constitutional:   ?   Appearance: She is well-developed.  ?HENT:  ?   Head: Normocephalic.  ?   Right Ear: External ear normal.  ?   Left Ear: External ear normal. There is no impacted cerumen.  ?Eyes:  ?   Pupils: Pupils are equal, round, and reactive to light.  ?Neck:  ?   Thyroid: No thyromegaly.  ?   Trachea: No  tracheal deviation.  ?Cardiovascular:  ?   Rate and Rhythm: Normal rate.  ?Pulmonary:  ?   Effort: Pulmonary effort is normal.  ?Abdominal:  ?   Palpations: Abdomen is soft.  ?Musculoskeletal:  ?   Cervical back: No rigidity.  ?Skin: ?   General: Skin is warm and dry.  ?Neurological:  ?   Mental Status: She is alert and oriented to person, place, and time.  ?Psychiatric:     ?   Behavior: Behavior normal.  ? ? ?Ortho Exam well-healed anterior cervical incision good cervical range of motion.  Patient has swelling MP joint and ulnar collateral ligament stress at the MP joint opens with point tenderness.  Opposite left hand thumb MP joint has slight opening but good endpoint. ? ?Specialty Comments:  ?No specialty comments available. ? ?Imaging: ?No results found. ? ? ?PMFS History: ?Patient Active Problem List  ? Diagnosis Date Noted  ? Gamekeeper's thumb, right 10/15/2021  ? Cervical spinal stenosis 06/12/2021  ? Current every day smoker 06/30/2017  ? ?Past Medical History:  ?Diagnosis Date  ? Allergy   ?  ?Family History  ?Problem Relation Age of Onset  ? Hypertension  Mother   ? Diabetes Mother   ? Depression Mother   ? Cancer Father 9  ?     renal  ? Renal cancer Father   ? Diabetes Maternal Grandmother   ? Leukemia Paternal Grandfather   ? Prostate cancer Neg Hx   ? Colon cancer Neg Hx   ?  ?Past Surgical History:  ?Procedure Laterality Date  ? ANTERIOR CERVICAL DECOMP/DISCECTOMY FUSION N/A 06/12/2021  ? Procedure: C5-6, C6-7 ANTERIOR CERVICAL DISCECTOMY FUSION, ALLOGRAFT, PLATE;  Surgeon: Marybelle Killings, MD;  Location: [place];  Service: Orthopedics;  Laterality: N/A;  ? WISDOM TOOTH EXTRACTION    ? ?Social History  ? ?Occupational History  ? Not on file  ?Tobacco Use  ? Smoking status: Some Days  ?  Packs/day: 0.50  ?  Years: 10.00  ?  Pack years: 5.00  ?  Types: Cigarettes  ?  Start date: 01/01/1998  ? Smokeless tobacco: Current  ?Vaping Use  ? Vaping Use: Never used  ?Substance and Sexual Activity  ? Alcohol use:  Yes  ?  Alcohol/week: 6.0 standard drinks  ?  Types: 6 Cans of beer per week  ? Drug use: No  ? Sexual activity: Yes  ?  Birth control/protection: I.U.D.  ? ? ? ? ? ? ?

## 2021-10-22 ENCOUNTER — Encounter: Payer: Self-pay | Admitting: Orthopedic Surgery

## 2021-10-22 ENCOUNTER — Ambulatory Visit (INDEPENDENT_AMBULATORY_CARE_PROVIDER_SITE_OTHER): Payer: BC Managed Care – PPO | Admitting: Orthopedic Surgery

## 2021-10-22 DIAGNOSIS — S63641A Sprain of metacarpophalangeal joint of right thumb, initial encounter: Secondary | ICD-10-CM

## 2021-10-22 NOTE — Progress Notes (Signed)
? ?Office Visit Note ?  ?Patient: Haley Allen           ?Date of Birth: 07-29-1980           ?MRN: [id number] ?Visit Date: 10/22/2021 ?             ?Requested by: Chevis Pretty, FNP ?[address] ?[place],  [place] [address] ?PCP: Chevis Pretty, FNP ? ? ?Assessment & Plan: ?Visit Diagnoses:  ?1. Gamekeeper's thumb of right hand, initial encounter   ? ? ?Plan: Discussed the nature of thumb UCL injuries with patient including the diagnosis, prognosis, and both conservative and surgical treatment options.  Interestingly she does not have any pain with stress at the MCP joint and seems to have a firm endpoint with symmetric laxity to the contralateral side.  Given that she continues to have pain now several months out from injury, I would like to get an MRI to further evaluate the ulnar collateral ligament.  I can see her back in the office once the MRI is completed to discuss the results and the next [facility] treatment. ? ?Follow-Up Instructions: No follow-ups on file.  ? ?Orders:  ?No orders of the defined types were placed in this encounter. ? ?No orders of the defined types were placed in this encounter. ? ? ? ? Procedures: ?No procedures performed ? ? ?Clinical Data: ?No additional findings. ? ? ?Subjective: ?Chief Complaint  ?Patient presents with  ? Right Thumb - New Patient (Initial Visit)  ? ? ?This is a 41 year old right-hand-dominant female presents with pain at the right thumb MCP joint.  She had a ground-level fall in February when she fell backwards and caught herself on her right hand.  Since that time she had pain and swelling at the ulnar aspect of the right thumb.  The swelling is actually improved since the injury.  She describes pain as throbbing in nature, localized to the thumb MCP joint, and worse with prolonged activity.  She has daily pain but it depends on how much she does.  If she has a days when she does a lot of activity including cleaning, mowing, etc. she has significant  pain.  She has worn a thumb spica brace off-and-on since February.  She has previously completed a prednisone taper.  She takes ibuprofen as needed for pain with modest relief. ? ? ?Review of Systems ? ? ?Objective: ?Vital Signs: There were no vitals taken for this visit. ? ?Physical Exam ?Constitutional:   ?   Appearance: Normal appearance.  ?Cardiovascular:  ?   Rate and Rhythm: Normal rate.  ?   Pulses: Normal pulses.  ?Pulmonary:  ?   Effort: Pulmonary effort is normal.  ?Skin: ?   General: Skin is warm and dry.  ?   Capillary Refill: Capillary refill takes less than 2 seconds.  ?Neurological:  ?   Mental Status: She is alert.  ? ? ?Right Hand Exam  ? ?Tenderness  ?Right hand tenderness location: TTP at thumb MCPJ, worse on ulnar side. ? ?Other  ?Erythema: absent ?Sensation: normal ?Pulse: present ? ?Comments:  Swelling isolated to ulnar side of thumb MCPJ.  No pain w/ radial deviation in extension or flexion w/ symmetric laxity compared to contralateral side and firm end out.  MP ROM from 10-60 deg (0-70) at contralateral side.  ? ? ? ? ?Specialty Comments:  ?No specialty comments available. ? ?Imaging: ?No results found. ? ? ?PMFS History: ?Patient Active Problem List  ? Diagnosis Date Noted  ?  Gamekeeper's thumb, right 10/15/2021  ? Cervical spinal stenosis 06/12/2021  ? Current every day smoker 06/30/2017  ? ?Past Medical History:  ?Diagnosis Date  ? Allergy   ?  ?Family History  ?Problem Relation Age of Onset  ? Hypertension Mother   ? Diabetes Mother   ? Depression Mother   ? Cancer Father 69  ?     renal  ? Renal cancer Father   ? Diabetes Maternal Grandmother   ? Leukemia Paternal Grandfather   ? Prostate cancer Neg Hx   ? Colon cancer Neg Hx   ?  ?Past Surgical History:  ?Procedure Laterality Date  ? ANTERIOR CERVICAL DECOMP/DISCECTOMY FUSION N/A 06/12/2021  ? Procedure: C5-6, C6-7 ANTERIOR CERVICAL DISCECTOMY FUSION, ALLOGRAFT, PLATE;  Surgeon: Marybelle Killings, MD;  Location: [place];  Service:  Orthopedics;  Laterality: N/A;  ? WISDOM TOOTH EXTRACTION    ? ?Social History  ? ?Occupational History  ? Not on file  ?Tobacco Use  ? Smoking status: Some Days  ?  Packs/day: 0.50  ?  Years: 10.00  ?  Pack years: 5.00  ?  Types: Cigarettes  ?  Start date: 01/01/1998  ? Smokeless tobacco: Current  ?Vaping Use  ? Vaping Use: Never used  ?Substance and Sexual Activity  ? Alcohol use: Yes  ?  Alcohol/week: 6.0 standard drinks  ?  Types: 6 Cans of beer per week  ? Drug use: No  ? Sexual activity: Yes  ?  Birth control/protection: I.U.D.  ? ? ? ? ? ? ?

## 2021-10-30 ENCOUNTER — Encounter: Payer: Self-pay | Admitting: Family Medicine

## 2021-10-30 ENCOUNTER — Ambulatory Visit (INDEPENDENT_AMBULATORY_CARE_PROVIDER_SITE_OTHER): Payer: BC Managed Care – PPO | Admitting: Family Medicine

## 2021-10-30 VITALS — BP 114/81 | HR 96 | Temp 97.8°F | Ht 68.0 in | Wt 184.2 lb

## 2021-10-30 DIAGNOSIS — L239 Allergic contact dermatitis, unspecified cause: Secondary | ICD-10-CM

## 2021-10-30 MED ORDER — HYDROXYZINE PAMOATE 25 MG PO CAPS: 25.0000 mg | ORAL_CAPSULE | Freq: Three times a day (TID) | ORAL | 0 refills | Status: DC | PRN

## 2021-10-30 MED ORDER — PREDNISONE 20 MG PO TABS: ORAL_TABLET | ORAL | 0 refills | Status: DC

## 2021-10-30 MED ORDER — METHYLPREDNISOLONE ACETATE 40 MG/ML IJ SUSP
40.0000 mg | Freq: Once | INTRAMUSCULAR | Status: AC
  Administered 2021-10-30: 40 mg via INTRAMUSCULAR

## 2021-10-30 NOTE — Progress Notes (Signed)
? ?Subjective: ?CC: Rash ?PCP: Bennie Pierini, FNP ?JHE:RDEYCXK Mcqueen is a 41 y.o. female presenting to clinic today for: ? ?1.  Leg rash ?Patient reports onset of bilateral lower extremity rash on Monday.  No known exposures.  She denies any new lotions, soaps, detergents, clothing or pets.  No new medications.  When she showers and gets out it feels like she has pins-and-needles on the legs.  Denies any swelling of the lower extremities.  She was worried that perhaps she might have cellulitis.  Her mother suffers from lymphedema and has had cellulitis many times in the lower extremities.  Patient has utilized nothing but lotion as treatment.  She takes Zyrtec daily.  Rash is itchy ? ? ?ROS: Per HPI ? ?Allergies  ?Allergen Reactions  ? Latex Other (See Comments)  ?  Skin redness  ? ?Past Medical History:  ?Diagnosis Date  ? Allergy   ? ? ?Current Outpatient Medications:  ?  cetirizine (ZYRTEC) 10 MG tablet, Take 1 tablet (10 mg total) by mouth daily., Disp: 90 tablet, Rfl: 1 ?  COLLAGEN PO, Take by mouth., Disp: , Rfl:  ?  fluticasone (FLONASE) 50 MCG/ACT nasal spray, Place 2 sprays into both nostrils daily as needed for allergies or rhinitis., Disp: 16 g, Rfl: 5 ?  furosemide (LASIX) 20 MG tablet, Take 1 tablet (20 mg total) by mouth daily., Disp: 90 tablet, Rfl: 1 ?  phentermine 37.5 MG capsule, Take 37.5 mg by mouth every morning., Disp: , Rfl:  ?  solifenacin (VESICARE) 5 MG tablet, Take 1 tablet (5 mg total) by mouth at bedtime., Disp: 90 tablet, Rfl: 1 ?Social History  ? ?Socioeconomic History  ? Marital status: Married  ?  Spouse name: Not on file  ? Number of children: Not on file  ? Years of education: Not on file  ? Highest education level: Not on file  ?Occupational History  ? Not on file  ?Tobacco Use  ? Smoking status: Some Days  ?  Packs/day: 0.50  ?  Years: 10.00  ?  Pack years: 5.00  ?  Types: Cigarettes  ?  Start date: 01/01/1998  ? Smokeless tobacco: Current  ?Vaping Use  ? Vaping Use:  Never used  ?Substance and Sexual Activity  ? Alcohol use: Yes  ?  Alcohol/week: 6.0 standard drinks  ?  Types: 6 Cans of beer per week  ? Drug use: No  ? Sexual activity: Yes  ?  Birth control/protection: I.U.D.  ?Other Topics Concern  ? Not on file  ?Social History Narrative  ? Not on file  ? ?Social Determinants of Health  ? ?Financial Resource Strain: Not on file  ?Food Insecurity: Not on file  ?Transportation Needs: Not on file  ?Physical Activity: Not on file  ?Stress: Not on file  ?Social Connections: Not on file  ?Intimate Partner Violence: Not on file  ? ?Family History  ?Problem Relation Age of Onset  ? Hypertension Mother   ? Diabetes Mother   ? Depression Mother   ? Cancer Father 47  ?     renal  ? Renal cancer Father   ? Diabetes Maternal Grandmother   ? Leukemia Paternal Grandfather   ? Prostate cancer Neg Hx   ? Colon cancer Neg Hx   ? ? ?Objective: ?Office vital signs reviewed. ?BP 114/81   Pulse 96   Temp 97.8 ?F (36.6 ?C)   Ht 5\' 8"  (1.727 m)   Wt 184 lb 3.2 oz (83.6 kg)  SpO2 98%   BMI 28.01 kg/m?  ? ?Physical Examination:  ?General: Awake, alert, well nourished, No acute distress ?Skin: Erythematous, warm maculopapular rash noted along bilateral lower extremities extending from ankle to knee.  No exudates, vesicles or bullae appreciated.  No induration or soft tissue swelling.  No pedal edema. ? ?Assessment/ Plan: ?41 y.o. female  ? ?Allergic contact dermatitis, unspecified trigger - Plan: methylPREDNISolone acetate (DEPO-MEDROL) injection 40 mg, predniSONE (DELTASONE) 20 MG tablet, hydrOXYzine (VISTARIL) 25 MG capsule ? ?Appears to have some type of contact dermatitis.  I do not think that her exam is consistent with a bilateral leg cellulitis.  She does not have any evidence of soft tissue swelling or induration on exam.  She does of course have some mild erythema and what feels like to be a sandpaper rash along the lower extremities.  She was given a dose of Depo-Medrol intramuscularly  today and I will start her on oral prednisone burst starting tomorrow.  She has Zyrtec on board but she may use the as needed hydroxyzine if needed for ongoing itching.  She understands red flag signs symptoms warranting further evaluation can follow-up as needed ? ?No orders of the defined types were placed in this encounter. ? ?No orders of the defined types were placed in this encounter. ? ? ? ?Raliegh Ip, DO ?[facility] ?[phone] ? ?

## 2021-10-30 NOTE — Patient Instructions (Signed)
Not sure what your trigger was. Doesn't look like cellulitis but we talked about the symptoms of that.  If no improvement in 1 week, I want you to follow up. ? ?Contact Dermatitis ?Dermatitis is redness, soreness, and swelling (inflammation) of the skin. Contact dermatitis is a reaction to something that touches the skin. ?There are two types of contact dermatitis: ?Irritant contact dermatitis. This happens when something bothers (irritates) your skin, like soap. ?Allergic contact dermatitis. This is caused when you are exposed to something that you are allergic to, such as poison ivy. ?What are the causes? ?Common causes of irritant contact dermatitis include: ?Makeup. ?Soaps. ?Detergents. ?Bleaches. ?Acids. ?Metals, such as nickel. ?Common causes of allergic contact dermatitis include: ?Plants. ?Chemicals. ?Jewelry. ?Latex. ?Medicines. ?Preservatives in products, such as clothing. ?What increases the risk? ?Having a job that exposes you to things that bother your skin. ?Having asthma or eczema. ?What are the signs or symptoms? ?Symptoms may happen anywhere the irritant has touched your skin. Symptoms include: ?Dry or flaky skin. ?Redness. ?Cracks. ?Itching. ?Pain or a burning feeling. ?Blisters. ?Blood or clear fluid draining from skin cracks. ?With allergic contact dermatitis, swelling may occur. This may happen in places such as the eyelids, mouth, or genitals. ?How is this treated? ?This condition is treated by checking for the cause of the reaction and protecting your skin. Treatment may also include: ?Steroid creams, ointments, or medicines. ?Antibiotic medicines or other ointments, if you have a skin infection. ?Lotion or medicines to help with itching. ?A bandage (dressing). ?Follow these instructions at home: ?Skin care ?Moisturize your skin as needed. ?Put cool cloths on your skin. ?Put a baking soda paste on your skin. Stir water into baking soda until it looks like a paste. ?Do not scratch your  skin. ?Avoid having things rub up against your skin. ?Avoid the use of soaps, perfumes, and dyes. ?Medicines ?Take or apply over-the-counter and prescription medicines only as told by your doctor. ?If you were prescribed an antibiotic medicine, take or apply it as told by your doctor. Do not stop using it even if your condition starts to get better. ?Bathing ?Take a bath with: ?Epsom salts. ?Baking soda. ?Colloidal oatmeal. ?Bathe less often. ?Bathe in warm water. Avoid using hot water. ?Bandage care ?If you were given a bandage, change it as told by your doctor. ?Wash your hands with soap and water before and after you change your bandage. If soap and water are not available, use hand sanitizer. ?General instructions ?Avoid the things that caused your reaction. If you do not know what caused it, keep a journal. Write down: ?What you eat. ?What skin products you use. ?What you drink. ?What you wear in the area that has symptoms. This includes jewelry. ?Check the affected areas every day for signs of infection. Check for: ?More redness, swelling, or pain. ?More fluid or blood. ?Warmth. ?Pus or a bad smell. ?Keep all follow-up visits as told by your doctor. This is important. ?Contact a doctor if: ?You do not get better with treatment. ?Your condition gets worse. ?You have signs of infection, such as: ?More swelling. ?Tenderness. ?More redness. ?Soreness. ?Warmth. ?You have a fever. ?You have new symptoms. ?Get help right away if: ?You have a very bad headache. ?You have neck pain. ?Your neck is stiff. ?You throw up (vomit). ?You feel very sleepy. ?You see red streaks coming from the area. ?Your bone or joint near the area hurts after the skin has healed. ?The area turns darker. ?You have  trouble breathing. ?Summary ?Dermatitis is redness, soreness, and swelling of the skin. ?Symptoms may occur where the irritant has touched you. ?Treatment may include medicines and skin care. ?If you do not know what caused your  reaction, keep a journal. ?Contact a doctor if your condition gets worse or you have signs of infection. ?This information is not intended to replace advice given to you by your health care provider. Make sure you discuss any questions you have with your health care provider. ?Document Revised: 03/23/2021 Document Reviewed: 03/23/2021 ?Elsevier Patient Education ? 2023 Elsevier Inc. ? ?

## 2021-11-09 DIAGNOSIS — E7800 Pure hypercholesterolemia, unspecified: Secondary | ICD-10-CM | POA: Diagnosis not present

## 2021-11-09 DIAGNOSIS — Z1339 Encounter for screening examination for other mental health and behavioral disorders: Secondary | ICD-10-CM | POA: Diagnosis not present

## 2021-11-09 DIAGNOSIS — Z1331 Encounter for screening for depression: Secondary | ICD-10-CM | POA: Diagnosis not present

## 2021-11-09 DIAGNOSIS — Z6829 Body mass index (BMI) 29.0-29.9, adult: Secondary | ICD-10-CM | POA: Diagnosis not present

## 2021-11-13 ENCOUNTER — Ambulatory Visit (HOSPITAL_COMMUNITY)
Admission: RE | Admit: 2021-11-13 | Discharge: 2021-11-13 | Disposition: A | Discharge status: Home / Self Care | Payer: BC Managed Care – PPO | Source: Ambulatory Visit | Attending: Orthopedic Surgery | Admitting: Orthopedic Surgery

## 2021-11-13 DIAGNOSIS — R600 Localized edema: Secondary | ICD-10-CM | POA: Diagnosis not present

## 2021-11-13 DIAGNOSIS — S63641A Sprain of metacarpophalangeal joint of right thumb, initial encounter: Secondary | ICD-10-CM | POA: Diagnosis not present

## 2021-11-13 DIAGNOSIS — M79644 Pain in right finger(s): Secondary | ICD-10-CM | POA: Diagnosis not present

## 2021-11-18 ENCOUNTER — Telehealth: Payer: Self-pay | Admitting: Orthopedic Surgery

## 2021-11-18 NOTE — Telephone Encounter (Signed)
Patient called in requesting for PT referral to be put in at the [facility] at [address] [place], [place] [address]

## 2021-11-18 NOTE — Telephone Encounter (Signed)
Would you like for her to make a appointment to discuss MRI results or is this something that is able to be discussed over the phone.

## 2021-11-18 NOTE — Telephone Encounter (Signed)
Please advise on protocol you want physical therapy to follow with patient

## 2021-11-18 NOTE — Telephone Encounter (Signed)
Pt called and wanting yo know mri results  CB [phone]

## 2021-11-19 ENCOUNTER — Other Ambulatory Visit: Payer: Self-pay | Admitting: Orthopedic Surgery

## 2021-11-19 DIAGNOSIS — S63641A Sprain of metacarpophalangeal joint of right thumb, initial encounter: Secondary | ICD-10-CM

## 2021-11-26 DIAGNOSIS — M25541 Pain in joints of right hand: Secondary | ICD-10-CM | POA: Diagnosis not present

## 2021-11-26 DIAGNOSIS — M25341 Other instability, right hand: Secondary | ICD-10-CM | POA: Diagnosis not present

## 2021-11-26 DIAGNOSIS — R531 Weakness: Secondary | ICD-10-CM | POA: Diagnosis not present

## 2021-11-26 DIAGNOSIS — S63641D Sprain of metacarpophalangeal joint of right thumb, subsequent encounter: Secondary | ICD-10-CM | POA: Diagnosis not present

## 2021-11-30 DIAGNOSIS — M25341 Other instability, right hand: Secondary | ICD-10-CM | POA: Diagnosis not present

## 2021-11-30 DIAGNOSIS — M25541 Pain in joints of right hand: Secondary | ICD-10-CM | POA: Diagnosis not present

## 2021-11-30 DIAGNOSIS — R531 Weakness: Secondary | ICD-10-CM | POA: Diagnosis not present

## 2021-11-30 DIAGNOSIS — S63641D Sprain of metacarpophalangeal joint of right thumb, subsequent encounter: Secondary | ICD-10-CM | POA: Diagnosis not present

## 2021-12-03 DIAGNOSIS — M25341 Other instability, right hand: Secondary | ICD-10-CM | POA: Diagnosis not present

## 2021-12-03 DIAGNOSIS — M25541 Pain in joints of right hand: Secondary | ICD-10-CM | POA: Diagnosis not present

## 2021-12-03 DIAGNOSIS — S63641D Sprain of metacarpophalangeal joint of right thumb, subsequent encounter: Secondary | ICD-10-CM | POA: Diagnosis not present

## 2021-12-03 DIAGNOSIS — R531 Weakness: Secondary | ICD-10-CM | POA: Diagnosis not present

## 2021-12-08 DIAGNOSIS — E7800 Pure hypercholesterolemia, unspecified: Secondary | ICD-10-CM | POA: Diagnosis not present

## 2021-12-08 DIAGNOSIS — R238 Other skin changes: Secondary | ICD-10-CM | POA: Diagnosis not present

## 2021-12-08 DIAGNOSIS — Z6829 Body mass index (BMI) 29.0-29.9, adult: Secondary | ICD-10-CM | POA: Diagnosis not present

## 2021-12-14 ENCOUNTER — Ambulatory Visit: Payer: BC Managed Care – PPO | Admitting: Nurse Practitioner

## 2021-12-21 DIAGNOSIS — R531 Weakness: Secondary | ICD-10-CM | POA: Diagnosis not present

## 2021-12-21 DIAGNOSIS — S63641D Sprain of metacarpophalangeal joint of right thumb, subsequent encounter: Secondary | ICD-10-CM | POA: Diagnosis not present

## 2021-12-21 DIAGNOSIS — M25541 Pain in joints of right hand: Secondary | ICD-10-CM | POA: Diagnosis not present

## 2021-12-21 DIAGNOSIS — M25341 Other instability, right hand: Secondary | ICD-10-CM | POA: Diagnosis not present

## 2021-12-23 DIAGNOSIS — M25541 Pain in joints of right hand: Secondary | ICD-10-CM | POA: Diagnosis not present

## 2021-12-23 DIAGNOSIS — R531 Weakness: Secondary | ICD-10-CM | POA: Diagnosis not present

## 2021-12-23 DIAGNOSIS — M25341 Other instability, right hand: Secondary | ICD-10-CM | POA: Diagnosis not present

## 2021-12-23 DIAGNOSIS — S63641D Sprain of metacarpophalangeal joint of right thumb, subsequent encounter: Secondary | ICD-10-CM | POA: Diagnosis not present

## 2021-12-29 DIAGNOSIS — J40 Bronchitis, not specified as acute or chronic: Secondary | ICD-10-CM | POA: Diagnosis not present

## 2021-12-29 DIAGNOSIS — R059 Cough, unspecified: Secondary | ICD-10-CM | POA: Diagnosis not present

## 2021-12-29 DIAGNOSIS — Z6829 Body mass index (BMI) 29.0-29.9, adult: Secondary | ICD-10-CM | POA: Diagnosis not present

## 2021-12-29 DIAGNOSIS — E7800 Pure hypercholesterolemia, unspecified: Secondary | ICD-10-CM | POA: Diagnosis not present

## 2022-01-02 ENCOUNTER — Other Ambulatory Visit: Payer: Self-pay | Admitting: Nurse Practitioner

## 2022-01-02 DIAGNOSIS — R3915 Urgency of urination: Secondary | ICD-10-CM

## 2022-01-08 ENCOUNTER — Other Ambulatory Visit: Payer: Self-pay | Admitting: Nurse Practitioner

## 2022-01-08 ENCOUNTER — Other Ambulatory Visit: Payer: Self-pay | Admitting: Orthopaedic Surgery

## 2022-01-08 DIAGNOSIS — R609 Edema, unspecified: Secondary | ICD-10-CM

## 2022-01-25 ENCOUNTER — Other Ambulatory Visit: Payer: Self-pay | Admitting: Nurse Practitioner

## 2022-01-25 DIAGNOSIS — J3089 Other allergic rhinitis: Secondary | ICD-10-CM

## 2022-01-29 DIAGNOSIS — Z6827 Body mass index (BMI) 27.0-27.9, adult: Secondary | ICD-10-CM | POA: Diagnosis not present

## 2022-01-29 DIAGNOSIS — Z79899 Other long term (current) drug therapy: Secondary | ICD-10-CM | POA: Diagnosis not present

## 2022-01-29 DIAGNOSIS — E7800 Pure hypercholesterolemia, unspecified: Secondary | ICD-10-CM | POA: Diagnosis not present

## 2022-02-04 ENCOUNTER — Other Ambulatory Visit: Payer: Self-pay | Admitting: Orthopaedic Surgery

## 2022-02-05 ENCOUNTER — Encounter: Payer: Self-pay | Admitting: Nurse Practitioner

## 2022-02-05 ENCOUNTER — Ambulatory Visit (INDEPENDENT_AMBULATORY_CARE_PROVIDER_SITE_OTHER): Payer: BC Managed Care – PPO | Admitting: Nurse Practitioner

## 2022-02-05 VITALS — BP 109/74 | HR 91 | Temp 97.7°F | Resp 20 | Ht 68.0 in | Wt 179.0 lb

## 2022-02-05 DIAGNOSIS — Z23 Encounter for immunization: Secondary | ICD-10-CM | POA: Diagnosis not present

## 2022-02-05 DIAGNOSIS — J301 Allergic rhinitis due to pollen: Secondary | ICD-10-CM | POA: Diagnosis not present

## 2022-02-05 DIAGNOSIS — M4802 Spinal stenosis, cervical region: Secondary | ICD-10-CM | POA: Diagnosis not present

## 2022-02-05 DIAGNOSIS — Z6827 Body mass index (BMI) 27.0-27.9, adult: Secondary | ICD-10-CM | POA: Diagnosis not present

## 2022-02-05 DIAGNOSIS — R3915 Urgency of urination: Secondary | ICD-10-CM

## 2022-02-05 NOTE — Progress Notes (Signed)
Subjective:    Patient ID: Haley Allen, female    DOB: 12/06/1980, 41 y.o.   MRN: [id number]   Chief Complaint: medical management of chronic issues     HPI:  Haley Allen is a 41 y.o. who identifies as a female who was assigned female at birth.   Social history: Lives with: husband and son Work history: school system   Comes in today for follow up of the following chronic medical issues:  1. Seasonal allergic rhinitis due to pollen Is on flonase and zyrtec. Is currently doing well.  2. Urinary urgency Is on daily vesicare which helps her from voiding so frequently.  3. Cervical spinal stenosis Use to be on robaxin but has not been taking.   4. BMI 28.0-28.9,adult Weight is down 5 lbs. She is currently on phentermine. Wt Readings from Last 3 Encounters:  02/05/22 179 lb (81.2 kg)  10/30/21 184 lb 3.2 oz (83.6 kg)  10/15/21 180 lb (81.6 kg)   BMI Readings from Last 3 Encounters:  02/05/22 27.22 kg/m  10/30/21 28.01 kg/m  10/15/21 27.37 kg/m      New complaints: None today  Allergies  Allergen Reactions   Latex Other (See Comments)    Skin redness   Outpatient Encounter Medications as of 02/05/2022  Medication Sig   cetirizine (ZYRTEC) 10 MG tablet TAKE 1 TABLET BY MOUTH EVERY DAY   COLLAGEN PO Take by mouth.   fluticasone (FLONASE) 50 MCG/ACT nasal spray Place 2 sprays into both nostrils daily as needed for allergies or rhinitis.   furosemide (LASIX) 20 MG tablet Take 1 tablet (20 mg total) by mouth daily. (NEEDS TO BE SEEN BEFORE NEXT REFILL)   hydrOXYzine (VISTARIL) 25 MG capsule Take 1 capsule (25 mg total) by mouth every 8 (eight) hours as needed for itching.   methocarbamol (ROBAXIN) 500 MG tablet TAKE 1 TABLET BY MOUTH EVERY 6 HOURS AS NEEDED FOR MUSCLE SPASMS.   phentermine 37.5 MG capsule Take 37.5 mg by mouth every morning.   solifenacin (VESICARE) 5 MG tablet TAKE 1 TABLET BY MOUTH EVERYDAY AT BEDTIME   [DISCONTINUED] predniSONE  (DELTASONE) 20 MG tablet 2 po at same time daily for 5 days Start 5/13   No facility-administered encounter medications on file as of 02/05/2022.    Past Surgical History:  Procedure Laterality Date   ANTERIOR CERVICAL DECOMP/DISCECTOMY FUSION N/A 06/12/2021   Procedure: C5-6, C6-7 ANTERIOR CERVICAL DISCECTOMY FUSION, ALLOGRAFT, PLATE;  Surgeon: Eldred Manges, MD;  Location: [facility] OR;  Service: Orthopedics;  Laterality: N/A;   WISDOM TOOTH EXTRACTION      Family History  Problem Relation Age of Onset   Hypertension Mother    Diabetes Mother    Depression Mother    Cancer Father 67       renal   Renal cancer Father    Diabetes Maternal Grandmother    Leukemia Paternal Grandfather    Prostate cancer Neg Hx    Colon cancer Neg Hx       Controlled substance contract: n/a     Review of Systems  Constitutional:  Negative for diaphoresis.  Eyes:  Negative for pain.  Respiratory:  Negative for shortness of breath.   Cardiovascular:  Negative for chest pain, palpitations and leg swelling.  Gastrointestinal:  Negative for abdominal pain.  Endocrine: Negative for polydipsia.  Skin:  Negative for rash.  Neurological:  Negative for dizziness, weakness and headaches.  Hematological:  Does not bruise/bleed easily.  All other systems reviewed  and are negative.      Objective:   Physical Exam Vitals and nursing note reviewed.  Constitutional:      General: She is not in acute distress.    Appearance: Normal appearance. She is well-developed.  HENT:     Head: Normocephalic.     Right Ear: Tympanic membrane normal.     Left Ear: Tympanic membrane normal.     Nose: Nose normal.     Mouth/Throat:     Mouth: Mucous membranes are moist.  Eyes:     Pupils: Pupils are equal, round, and reactive to light.  Neck:     Vascular: No carotid bruit or JVD.  Cardiovascular:     Rate and Rhythm: Normal rate and regular rhythm.     Heart sounds: Normal heart sounds.  Pulmonary:      Effort: Pulmonary effort is normal. No respiratory distress.     Breath sounds: Normal breath sounds. No wheezing or rales.  Chest:     Chest wall: No tenderness.  Abdominal:     General: Bowel sounds are normal. There is no distension or abdominal bruit.     Palpations: Abdomen is soft. There is no hepatomegaly, splenomegaly, mass or pulsatile mass.     Tenderness: There is no abdominal tenderness.  Musculoskeletal:        General: Normal range of motion.     Cervical back: Normal range of motion and neck supple.  Lymphadenopathy:     Cervical: No cervical adenopathy.  Skin:    General: Skin is warm and dry.  Neurological:     Mental Status: She is alert and oriented to person, place, and time.     Deep Tendon Reflexes: Reflexes are normal and symmetric.  Psychiatric:        Behavior: Behavior normal.        Thought Content: Thought content normal.        Judgment: Judgment normal.    BP 109/74   Pulse 91   Temp 97.7 F (36.5 C) (Temporal)   Resp 20   Ht 5\' 8"  (1.727 m)   Wt 179 lb (81.2 kg)   SpO2 100%   BMI 27.22 kg/m         Assessment & Plan:   Haley Allen comes in today with chief complaint of Medical Management of Chronic Issues   Diagnosis and orders addressed:  1. Seasonal allergic rhinitis due to pollen Continue zyrtec and flonase  2. Urinary urgency Continue vesicare  3. Cervical spinal stenosis Motrin or tylenol as needed  4. BMI 27.0-27.9,adult Discussed diet and exercise for person with BMI >25 Will recheck weight in 3-6 months    Labs were drawn by [facility] where she goes for weight loss. Health Maintenance reviewed Diet and exercise encouraged  Follow up plan: 6 months   [name], FNP

## 2022-02-05 NOTE — Patient Instructions (Signed)
Tdap (Tetanus, Diphtheria, Pertussis) Vaccine: What You Need to Know 1. Why get vaccinated? Tdap vaccine can prevent tetanus, diphtheria, and pertussis. Diphtheria and pertussis spread from person to person. Tetanus enters the body through cuts or wounds. TETANUS (T) causes painful stiffening of the muscles. Tetanus can lead to serious health problems, including being unable to open the mouth, having trouble swallowing and breathing, or death. DIPHTHERIA (D) can lead to difficulty breathing, heart failure, paralysis, or death. PERTUSSIS (aP), also known as "whooping cough," can cause uncontrollable, violent coughing that makes it hard to breathe, eat, or drink. Pertussis can be extremely serious especially in babies and young children, causing pneumonia, convulsions, brain damage, or death. In teens and adults, it can cause weight loss, loss of bladder control, passing out, and rib fractures from severe coughing. 2. Tdap vaccine Tdap is only for children 7 years and older, adolescents, and adults.  Adolescents should receive a single dose of Tdap, preferably at age 11 or 12 years. Pregnant people should get a dose of Tdap during every pregnancy, preferably during the early part of the third trimester, to help protect the newborn from pertussis. Infants are most at risk for severe, life-threatening complications from pertussis. Adults who have never received Tdap should get a dose of Tdap. Also, adults should receive a booster dose of either Tdap or Td (a different vaccine that protects against tetanus and diphtheria but not pertussis) every 10 years, or after 5 years in the case of a severe or dirty wound or burn. Tdap may be given at the same time as other vaccines. 3. Talk with your health care provider Tell your vaccine provider if the person getting the vaccine: Has had an allergic reaction after a previous dose of any vaccine that protects against tetanus, diphtheria, or pertussis, or has any  severe, life-threatening allergies Has had a coma, decreased level of consciousness, or prolonged seizures within 7 days after a previous dose of any pertussis vaccine (DTP, DTaP, or Tdap) Has seizures or another nervous system problem Has ever had Guillain-Barr Syndrome (also called "GBS") Has had severe pain or swelling after a previous dose of any vaccine that protects against tetanus or diphtheria In some cases, your health care provider may decide to postpone Tdap vaccination until a future visit. People with minor illnesses, such as a cold, may be vaccinated. People who are moderately or severely ill should usually wait until they recover before getting Tdap vaccine.  Your health care provider can give you more information. 4. Risks of a vaccine reaction Pain, redness, or swelling where the shot was given, mild fever, headache, feeling tired, and nausea, vomiting, diarrhea, or stomachache sometimes happen after Tdap vaccination. People sometimes faint after medical procedures, including vaccination. Tell your provider if you feel dizzy or have vision changes or ringing in the ears.  As with any medicine, there is a very remote chance of a vaccine causing a severe allergic reaction, other serious injury, or death. 5. What if there is a serious problem? An allergic reaction could occur after the vaccinated person leaves the clinic. If you see signs of a severe allergic reaction (hives, swelling of the face and throat, difficulty breathing, a fast heartbeat, dizziness, or weakness), call 9-1-1 and get the person to the nearest hospital. For other signs that concern you, call your health care provider.  Adverse reactions should be reported to the Vaccine Adverse Event Reporting System (VAERS). Your health care provider will usually file this report, or you   can do it yourself. Visit the VAERS website at www.vaers.hhs.gov or call 1-800-822-7967. VAERS is only for reporting reactions, and VAERS staff  members do not give medical advice. 6. The National Vaccine Injury Compensation Program The National Vaccine Injury Compensation Program (VICP) is a federal program that was created to compensate people who may have been injured by certain vaccines. Claims regarding alleged injury or death due to vaccination have a time limit for filing, which may be as short as two years. Visit the VICP website at www.hrsa.gov/vaccinecompensation or call 1-800-338-2382 to learn about the program and about filing a claim. 7. How can I learn more? Ask your health care provider. Call your local or state health department. Visit the website of the Food and Drug Administration (FDA) for vaccine package inserts and additional information at www.fda.gov/vaccines-blood-biologics/vaccines. Contact the Centers for Disease Control and Prevention (CDC): Call 1-800-232-4636 (1-800-CDC-INFO) or Visit CDC's website at www.cdc.gov/vaccines. Source: CDC Vaccine Information Statement Tdap (Tetanus, Diphtheria, Pertussis) Vaccine (01/25/2020) This same material is available at www.cdc.gov for no charge. This information is not intended to replace advice given to you by your health care provider. Make sure you discuss any questions you have with your health care provider. Document Revised: 05/06/2021 Document Reviewed: 03/09/2021 Elsevier Patient Education  2023 Elsevier Inc.  

## 2022-02-08 NOTE — Addendum Note (Signed)
Addended by: Cleda Daub on: 02/08/2022 03:37 PM   Modules accepted: Orders

## 2022-02-25 ENCOUNTER — Ambulatory Visit (INDEPENDENT_AMBULATORY_CARE_PROVIDER_SITE_OTHER): Payer: BC Managed Care – PPO | Admitting: Nurse Practitioner

## 2022-02-25 ENCOUNTER — Encounter: Payer: Self-pay | Admitting: Nurse Practitioner

## 2022-02-25 VITALS — BP 119/84 | HR 111 | Temp 97.9°F | Resp 20 | Ht 68.0 in | Wt 180.0 lb

## 2022-02-25 DIAGNOSIS — Z6827 Body mass index (BMI) 27.0-27.9, adult: Secondary | ICD-10-CM

## 2022-02-25 DIAGNOSIS — J301 Allergic rhinitis due to pollen: Secondary | ICD-10-CM | POA: Diagnosis not present

## 2022-02-25 DIAGNOSIS — F17200 Nicotine dependence, unspecified, uncomplicated: Secondary | ICD-10-CM | POA: Diagnosis not present

## 2022-02-25 DIAGNOSIS — R3915 Urgency of urination: Secondary | ICD-10-CM | POA: Diagnosis not present

## 2022-02-25 DIAGNOSIS — R609 Edema, unspecified: Secondary | ICD-10-CM

## 2022-02-25 MED ORDER — FLUTICASONE PROPIONATE 50 MCG/ACT NA SUSP: 2.0000 | Freq: Every day | NASAL | 5 refills | Status: AC | PRN

## 2022-02-25 MED ORDER — FUROSEMIDE 20 MG PO TABS: 20.0000 mg | ORAL_TABLET | Freq: Every day | ORAL | 0 refills | Status: DC

## 2022-02-25 MED ORDER — PHENTERMINE HCL 37.5 MG PO CAPS: 37.5000 mg | ORAL_CAPSULE | ORAL | 2 refills | Status: DC

## 2022-02-25 NOTE — Progress Notes (Signed)
Subjective:    Patient ID: Haley Allen, female    DOB: 1981/02/09, 41 y.o.   MRN: [id number]   Chief Complaint: medical management of chronic issues     HPI:  Haley Allen is a 41 y.o. who identifies as a female who was assigned female at birth.   Social history: Lives with: husband and son Work history: work in [employer]   Comes in today for follow up of the following chronic medical issues:  1. Seasonal allergic rhinitis due to pollen Is on zyrtec and flonase daily and is doing well.  2. Urinary urgency Is on vesicare daily which has worked well.  3. Peripheral edema Is on lasix on prn basis. Does not have to take daily.  4. Smoker Smokes over a pack a day  5. BMI 27.0-27.9,adult She is currently on phentermine and has lost some weight. Wt Readings from Last 3 Encounters:  02/25/22 180 lb (81.6 kg)  02/05/22 179 lb (81.2 kg)  10/30/21 184 lb 3.2 oz (83.6 kg)   BMI Readings from Last 3 Encounters:  02/25/22 27.37 kg/m  02/05/22 27.22 kg/m  10/30/21 28.01 kg/m      New complaints: None today  Allergies  Allergen Reactions   Latex Other (See Comments)    Skin redness   Outpatient Encounter Medications as of 02/25/2022  Medication Sig   cetirizine (ZYRTEC) 10 MG tablet TAKE 1 TABLET BY MOUTH EVERY DAY   COLLAGEN PO Take by mouth.   fluticasone (FLONASE) 50 MCG/ACT nasal spray Place 2 sprays into both nostrils daily as needed for allergies or rhinitis.   furosemide (LASIX) 20 MG tablet Take 1 tablet (20 mg total) by mouth daily. (NEEDS TO BE SEEN BEFORE NEXT REFILL)   phentermine 37.5 MG capsule Take 37.5 mg by mouth every morning.   solifenacin (VESICARE) 5 MG tablet TAKE 1 TABLET BY MOUTH EVERYDAY AT BEDTIME   No facility-administered encounter medications on file as of 02/25/2022.    Past Surgical History:  Procedure Laterality Date   ANTERIOR CERVICAL DECOMP/DISCECTOMY FUSION N/A 06/12/2021   Procedure: C5-6, C6-7  ANTERIOR CERVICAL DISCECTOMY FUSION, ALLOGRAFT, PLATE;  Surgeon: Eldred Manges, MD;  Location: [facility] OR;  Service: Orthopedics;  Laterality: N/A;   WISDOM TOOTH EXTRACTION      Family History  Problem Relation Age of Onset   Hypertension Mother    Diabetes Mother    Depression Mother    Cancer Father 53       renal   Renal cancer Father    Diabetes Maternal Grandmother    Leukemia Paternal Grandfather    Prostate cancer Neg Hx    Colon cancer Neg Hx       Controlled substance contract: n/a     Review of Systems  Constitutional:  Negative for diaphoresis.  Eyes:  Negative for pain.  Respiratory:  Negative for shortness of breath.   Cardiovascular:  Negative for chest pain, palpitations and leg swelling.  Gastrointestinal:  Negative for abdominal pain.  Endocrine: Negative for polydipsia.  Skin:  Negative for rash.  Neurological:  Negative for dizziness, weakness and headaches.  Hematological:  Does not bruise/bleed easily.  All other systems reviewed and are negative.      Objective:   Physical Exam Vitals and nursing note reviewed.  Constitutional:      General: She is not in acute distress.    Appearance: Normal appearance. She is well-developed.  HENT:     Head: Normocephalic.  Right Ear: Tympanic membrane normal.     Left Ear: Tympanic membrane normal.     Nose: Nose normal.     Mouth/Throat:     Mouth: Mucous membranes are moist.  Eyes:     Pupils: Pupils are equal, round, and reactive to light.  Neck:     Vascular: No carotid bruit or JVD.  Cardiovascular:     Rate and Rhythm: Normal rate and regular rhythm.     Heart sounds: Normal heart sounds.  Pulmonary:     Effort: Pulmonary effort is normal. No respiratory distress.     Breath sounds: Normal breath sounds. No wheezing or rales.  Chest:     Chest wall: No tenderness.  Abdominal:     General: Bowel sounds are normal. There is no distension or abdominal bruit.     Palpations: Abdomen is soft.  There is no hepatomegaly, splenomegaly, mass or pulsatile mass.     Tenderness: There is no abdominal tenderness.  Musculoskeletal:        General: Normal range of motion.     Cervical back: Normal range of motion and neck supple.  Lymphadenopathy:     Cervical: No cervical adenopathy.  Skin:    General: Skin is warm and dry.  Neurological:     Mental Status: She is alert and oriented to person, place, and time.     Deep Tendon Reflexes: Reflexes are normal and symmetric.  Psychiatric:        Behavior: Behavior normal.        Thought Content: Thought content normal.        Judgment: Judgment normal.     BP 119/84   Pulse (!) 111   Temp 97.9 F (36.6 C) (Temporal)   Resp 20   Ht 5\' 8"  (1.727 m)   Wt 180 lb (81.6 kg)   SpO2 99%   BMI 27.37 kg/m        Assessment & Plan:  Haley Allen comes in today with chief complaint of Medical Management of Chronic Issues   Diagnosis and orders addressed:  1. Seasonal allergic rhinitis due to pollen Avoid allergens - fluticasone (FLONASE) 50 MCG/ACT nasal spray; Place 2 sprays into both nostrils daily as needed for allergies or rhinitis.  Dispense: 16 g; Refill: 5  2. Urinary urgency  3. Peripheral edema Elevate legs when sitting - furosemide (LASIX) 20 MG tablet; Take 1 tablet (20 mg total) by mouth daily. (NEEDS TO BE SEEN BEFORE NEXT REFILL)  Dispense: 90 tablet; Refill: 0  4. Smoker Smoking cessation encouraged 5. BMI 27.0-27.9,adult Discussed diet and exercise for person with BMI >25 Will recheck weight in 3-6 months  - phentermine 37.5 MG capsule; Take 1 capsule (37.5 mg total) by mouth every morning.  Dispense: 30 capsule; Refill: 2   FOllow up plan: 6 months   [name] 07-25-1970, FNP

## 2022-02-25 NOTE — Patient Instructions (Signed)

## 2022-03-04 DIAGNOSIS — L821 Other seborrheic keratosis: Secondary | ICD-10-CM | POA: Diagnosis not present

## 2022-03-04 DIAGNOSIS — L814 Other melanin hyperpigmentation: Secondary | ICD-10-CM | POA: Diagnosis not present

## 2022-03-04 DIAGNOSIS — D225 Melanocytic nevi of trunk: Secondary | ICD-10-CM | POA: Diagnosis not present

## 2022-03-04 DIAGNOSIS — D2239 Melanocytic nevi of other parts of face: Secondary | ICD-10-CM | POA: Diagnosis not present

## 2022-06-02 ENCOUNTER — Other Ambulatory Visit: Payer: Self-pay | Admitting: Nurse Practitioner

## 2022-06-02 DIAGNOSIS — Z6827 Body mass index (BMI) 27.0-27.9, adult: Secondary | ICD-10-CM

## 2022-06-02 DIAGNOSIS — R600 Localized edema: Secondary | ICD-10-CM

## 2022-06-02 DIAGNOSIS — R3915 Urgency of urination: Secondary | ICD-10-CM

## 2022-06-02 DIAGNOSIS — R609 Edema, unspecified: Secondary | ICD-10-CM

## 2022-06-03 NOTE — Telephone Encounter (Signed)
Ntbs for weight management to get phentermine

## 2022-06-07 ENCOUNTER — Ambulatory Visit (INDEPENDENT_AMBULATORY_CARE_PROVIDER_SITE_OTHER): Payer: BC Managed Care – PPO | Admitting: Nurse Practitioner

## 2022-06-07 ENCOUNTER — Encounter: Payer: Self-pay | Admitting: Nurse Practitioner

## 2022-06-07 VITALS — BP 112/83 | HR 90 | Temp 97.7°F | Resp 20 | Ht 68.0 in | Wt 182.0 lb

## 2022-06-07 DIAGNOSIS — R609 Edema, unspecified: Secondary | ICD-10-CM

## 2022-06-07 DIAGNOSIS — F17200 Nicotine dependence, unspecified, uncomplicated: Secondary | ICD-10-CM

## 2022-06-07 DIAGNOSIS — R3915 Urgency of urination: Secondary | ICD-10-CM

## 2022-06-07 DIAGNOSIS — Z6827 Body mass index (BMI) 27.0-27.9, adult: Secondary | ICD-10-CM

## 2022-06-07 MED ORDER — SOLIFENACIN SUCCINATE 5 MG PO TABS: ORAL_TABLET | ORAL | 1 refills | Status: DC

## 2022-06-07 MED ORDER — PHENTERMINE HCL 37.5 MG PO CAPS: 37.5000 mg | ORAL_CAPSULE | ORAL | 2 refills | Status: DC

## 2022-06-07 MED ORDER — FUROSEMIDE 20 MG PO TABS: 20.0000 mg | ORAL_TABLET | Freq: Every day | ORAL | 1 refills | Status: DC

## 2022-06-07 NOTE — Progress Notes (Signed)
Subjective:    Patient ID: Haley Allen, female    DOB: 27-Jan-1981, 41 y.o.   MRN: [id number]   Chief Complaint: medical management of chronic issues     HPI:  Haley Allen is a 41 y.o. who identifies as a female who was assigned female at birth.   Social history: Lives with: husband and son Work history: [employer]   Comes in today for follow up of the following chronic medical issues:  1. Peripheral edema She is on lasix on an as needed basis. Has frequent edema.  2. Urinary urgency Is on vesicare daiy and is doing well.  3. Smoker Smokes at least 1/2 pack a day.  4. BMI 27.0-27.9,adult Has been on phentermine and has lost some weight. Wt Readings from Last 3 Encounters:  06/07/22 182 lb (82.6 kg)  02/25/22 180 lb (81.6 kg)  02/05/22 179 lb (81.2 kg)   BMI Readings from Last 3 Encounters:  06/07/22 27.67 kg/m  02/25/22 27.37 kg/m  02/05/22 27.22 kg/m      New complaints: None today  Allergies  Allergen Reactions   Latex Other (See Comments)    Skin redness   Outpatient Encounter Medications as of 06/07/2022  Medication Sig   cetirizine (ZYRTEC) 10 MG tablet TAKE 1 TABLET BY MOUTH EVERY DAY   COLLAGEN PO Take by mouth.   fluticasone (FLONASE) 50 MCG/ACT nasal spray Place 2 sprays into both nostrils daily as needed for allergies or rhinitis.   furosemide (LASIX) 20 MG tablet Take 1 tablet (20 mg total) by mouth daily. (NEEDS TO BE SEEN BEFORE NEXT REFILL)   phentermine 37.5 MG capsule Take 1 capsule (37.5 mg total) by mouth every morning.   solifenacin (VESICARE) 5 MG tablet TAKE 1 TABLET BY MOUTH EVERYDAY AT BEDTIME   No facility-administered encounter medications on file as of 06/07/2022.    Past Surgical History:  Procedure Laterality Date   ANTERIOR CERVICAL DECOMP/DISCECTOMY FUSION N/A 06/12/2021   Procedure: C5-6, C6-7 ANTERIOR CERVICAL DISCECTOMY FUSION, ALLOGRAFT, PLATE;  Surgeon: Eldred Manges, MD;  Location: [facility]  OR;  Service: Orthopedics;  Laterality: N/A;   WISDOM TOOTH EXTRACTION      Family History  Problem Relation Age of Onset   Hypertension Mother    Diabetes Mother    Depression Mother    Cancer Father 49       renal   Renal cancer Father    Diabetes Maternal Grandmother    Leukemia Paternal Grandfather    Prostate cancer Neg Hx    Colon cancer Neg Hx       Controlled substance contract: n/a     Review of Systems  Constitutional:  Negative for diaphoresis.  Eyes:  Negative for pain.  Respiratory:  Negative for shortness of breath.   Cardiovascular:  Negative for chest pain, palpitations and leg swelling.  Gastrointestinal:  Negative for abdominal pain.  Endocrine: Negative for polydipsia.  Skin:  Negative for rash.  Neurological:  Negative for dizziness, weakness and headaches.  Hematological:  Does not bruise/bleed easily.  All other systems reviewed and are negative.      Objective:   Physical Exam Vitals and nursing note reviewed.  Constitutional:      General: She is not in acute distress.    Appearance: Normal appearance. She is well-developed.  HENT:     Head: Normocephalic.     Right Ear: Tympanic membrane normal.     Left Ear: Tympanic membrane normal.  Nose: Nose normal.     Mouth/Throat:     Mouth: Mucous membranes are moist.  Eyes:     Pupils: Pupils are equal, round, and reactive to light.  Neck:     Vascular: No carotid bruit or JVD.  Cardiovascular:     Rate and Rhythm: Normal rate and regular rhythm.     Heart sounds: Normal heart sounds.  Pulmonary:     Effort: Pulmonary effort is normal. No respiratory distress.     Breath sounds: Normal breath sounds. No wheezing or rales.  Chest:     Chest wall: No tenderness.  Abdominal:     General: Bowel sounds are normal. There is no distension or abdominal bruit.     Palpations: Abdomen is soft. There is no hepatomegaly, splenomegaly, mass or pulsatile mass.     Tenderness: There is no  abdominal tenderness.  Musculoskeletal:        General: Normal range of motion.     Cervical back: Normal range of motion and neck supple.  Lymphadenopathy:     Cervical: No cervical adenopathy.  Skin:    General: Skin is warm and dry.  Neurological:     Mental Status: She is alert and oriented to person, place, and time.     Deep Tendon Reflexes: Reflexes are normal and symmetric.  Psychiatric:        Behavior: Behavior normal.        Thought Content: Thought content normal.        Judgment: Judgment normal.     BP 112/83   Pulse 90   Temp 97.7 F (36.5 C) (Temporal)   Resp 20   Ht 5\' 8"  (1.727 m)   Wt 182 lb (82.6 kg)   SpO2 100%   BMI 27.67 kg/m        Assessment & Plan:   Haley Allen comes in today with chief complaint of Medical Management of Chronic Issues   Diagnosis and orders addressed:  1. Peripheral edema - furosemide (LASIX) 20 MG tablet; Take 1 tablet (20 mg total) by mouth daily. (NEEDS TO BE SEEN BEFORE NEXT REFILL)  Dispense: 90 tablet; Refill: 1  2. Urinary urgency Force fluids - solifenacin (VESICARE) 5 MG tablet; TAKE 1 TABLET BY MOUTH EVERYDAY AT BEDTIME  Dispense: 90 tablet; Refill: 1  3. Smoker Smoking cessation encouraged  4. BMI 27.0-27.9,adult Discussed diet and exercise for person with BMI >25 Will recheck weight in 3-6 months  - phentermine 37.5 MG capsule; Take 1 capsule (37.5 mg total) by mouth every morning.  Dispense: 30 capsule; Refill: 2   Labs pending Health Maintenance reviewed Diet and exercise encouraged  Follow up plan: 6 months   Haley Allen 07-25-1972, FNP

## 2022-06-07 NOTE — Patient Instructions (Signed)

## 2022-07-02 ENCOUNTER — Other Ambulatory Visit: Payer: Self-pay | Admitting: Nurse Practitioner

## 2022-07-02 DIAGNOSIS — J3089 Other allergic rhinitis: Secondary | ICD-10-CM

## 2022-07-29 ENCOUNTER — Telehealth (INDEPENDENT_AMBULATORY_CARE_PROVIDER_SITE_OTHER): Payer: BC Managed Care – PPO | Admitting: Nurse Practitioner

## 2022-07-29 ENCOUNTER — Encounter: Payer: Self-pay | Admitting: Nurse Practitioner

## 2022-07-29 DIAGNOSIS — H1032 Unspecified acute conjunctivitis, left eye: Secondary | ICD-10-CM

## 2022-07-29 MED ORDER — POLYMYXIN B-TRIMETHOPRIM 10000-0.1 UNIT/ML-% OP SOLN: 2.0000 [drp] | OPHTHALMIC | 0 refills | Status: DC

## 2022-07-29 NOTE — Progress Notes (Signed)
Virtual Visit Consent   Haley Allen, you are scheduled for Allen virtual visit with Haley Allen, [place], Allen [facility] provider, today.     Just as with appointments in the office, your consent must be obtained to participate.  Your consent will be active for this visit and any virtual visit you may have with one of our providers in the next 365 days.     If you have Allen MyChart account, Allen copy of this consent can be sent to you electronically.  All virtual visits are billed to your insurance company just like Allen traditional visit in the office.    As this is Allen virtual visit, video technology does not allow for your provider to perform Allen traditional examination.  This may limit your provider's ability to fully assess your condition.  If your provider identifies any concerns that need to be evaluated in person or the need to arrange testing (such as labs, EKG, etc.), we will make arrangements to do so.     Although advances in technology are sophisticated, we cannot ensure that it will always work on either your end or our end.  If the connection with Allen video visit is poor, the visit may have to be switched to Allen telephone visit.  With either Allen video or telephone visit, we are not always able to ensure that we have Allen secure connection.     I need to obtain your verbal consent now.   Are you willing to proceed with your visit today? YES   Haley Allen has provided verbal consent on 07/29/2022 for Allen virtual visit (video or telephone).   Haley Hassell Done, FNP   Date: 07/29/2022 9:57 AM   Virtual Visit via Video Note   I, Haley Allen, connected with Haley Allen ([id number], 1981/03/02) on 07/29/22 at  9:30 AM EST by Allen video-enabled telemedicine application and verified that I am speaking with the correct person using two identifiers.  Location: Patient: Virtual Visit Location Patient: Home Provider: Virtual Visit Location Provider: Mobile   I discussed the limitations of  evaluation and management by telemedicine and the availability of in person appointments. The patient expressed understanding and agreed to proceed.    History of Present Illness: Haley Allen is Allen 42 y.o. who identifies as Allen female who was assigned female at birth, and is being seen today for pink eye.  HPI: Patient says she woke uo yesterday morning and her eye was Allen little irritated. As the day wore on it worsened t red watery this morning to eye lashes were matted together. Very irritated.  Conjunctivitis     ROS  Problems:  Patient Active Problem List   Diagnosis Date Noted   Peripheral edema 02/25/2022   Seasonal allergic rhinitis due to pollen 02/05/2022   Urinary urgency 02/05/2022   BMI 27.0-27.9,adult 02/05/2022   Gamekeeper's thumb, right 10/15/2021   Cervical spinal stenosis 06/12/2021   Smoker 06/30/2017    Allergies:  Allergies  Allergen Reactions   Latex Other (See Comments)    Skin redness   Medications:  Current Outpatient Medications:    cetirizine (ZYRTEC) 10 MG tablet, TAKE 1 TABLET BY MOUTH EVERY DAY, Disp: 90 tablet, Rfl: 1   fluticasone (FLONASE) 50 MCG/ACT nasal spray, Place 2 sprays into both nostrils daily as needed for allergies or rhinitis., Disp: 16 g, Rfl: 5   furosemide (LASIX) 20 MG tablet, Take 1 tablet (20 mg total) by mouth daily. (NEEDS TO BE SEEN BEFORE NEXT REFILL),  Disp: 90 tablet, Rfl: 1   Multiple Vitamin (MULTIVITAMIN PO), Take by mouth., Disp: , Rfl:    phentermine 37.5 MG capsule, Take 1 capsule (37.5 mg total) by mouth every morning., Disp: 30 capsule, Rfl: 2   solifenacin (VESICARE) 5 MG tablet, TAKE 1 TABLET BY MOUTH EVERYDAY AT BEDTIME, Disp: 90 tablet, Rfl: 1  Observations/Objective: Patient is well-developed, well-nourished in no acute distress.  Resting comfortably  at home.  Head is normocephalic, atraumatic.  No labored breathing.  Speech is clear and coherent with logical content.  Patient is alert and oriented at  baseline.  Left scleral injection Yellow discharge left inner canthus  Assessment and Plan:  Haley Allen in today with chief complaint of Conjunctivitis   1. Acute bacterial conjunctivitis of left eye Good handwashing Cool compresses  Meds ordered this encounter  Medications   trimethoprim-polymyxin b (POLYTRIM) ophthalmic solution    Sig: Place 2 drops into both eyes every 4 (four) hours.    Dispense:  10 mL    Refill:  0    Order Specific Question:   Supervising Provider    Answer:   Haley Allen [[id number]]      Follow Up Instructions: I discussed the assessment and treatment plan with the patient. The patient was provided an opportunity to ask questions and all were answered. The patient agreed with the plan and demonstrated an understanding of the instructions.  Allen copy of instructions were sent to the patient via MyChart.  The patient was advised to call back or seek an in-person evaluation if the symptoms worsen or if the condition fails to improve as anticipated.  Time:  I spent 7 minutes with the patient via telehealth technology discussing the above problems/concerns.    Haley Hassell Done, FNP

## 2022-07-29 NOTE — Patient Instructions (Signed)
Bacterial Conjunctivitis, Adult ?Bacterial conjunctivitis is an infection of the clear membrane that covers the white part of the eye and the inner surface of the eyelid (conjunctiva). When the blood vessels in the conjunctiva become inflamed, the eye becomes red or pink. The eye often feels irritated or itchy. Bacterial conjunctivitis spreads easily from person to person (is contagious). It also spreads easily from one eye to the other eye. ?What are the causes? ?This condition is caused by bacteria. You may get the infection if you come into close contact with: ?A person who is infected with the bacteria. ?Items that are contaminated with the bacteria, such as a face towel, contact lens solution, or eye makeup. ?What increases the risk? ?You are more likely to develop this condition if: ?You are exposed to other people who have the infection. ?You wear contact lenses. ?You have a sinus infection. ?You have had a recent eye injury or surgery. ?You have a weak body defense system (immune system). ?You have a medical condition that causes dry eyes. ?What are the signs or symptoms? ?Symptoms of this condition include: ?Thick, yellowish discharge from the eye. This may turn into a crust on the eyelid overnight and cause your eyelids to stick together. ?Tearing or watery eyes. ?Itchy eyes. ?Burning feeling in your eyes. ?Eye redness. ?Swollen eyelids. ?Blurred vision. ?How is this diagnosed? ?This condition is diagnosed based on your symptoms and medical history. Your health care provider may also take a sample of discharge from your eye to find the cause of your infection. ?How is this treated? ?This condition may be treated with: ?Antibiotic eye drops or ointment to clear the infection more quickly and prevent the spread of infection to others. ?Antibiotic medicines taken by mouth (orally) to treat infections that do not respond to drops or ointments or that last longer than 10 days. ?Cool, wet cloths (cool  compresses) placed on the eyes. ?Artificial tears applied 2-6 times a day. ?Follow these instructions at home: ?Medicines ?Take or apply your antibiotic medicine as told by your health care provider. Do not stop using the antibiotic, even if your condition improves, unless directed by your health care provider. ?Take or apply over-the-counter and prescription medicines only as told by your health care provider. ?Be very careful to avoid touching the edge of your eyelid with the eye-drop bottle or the ointment tube when you apply medicines to the affected eye. This will keep you from spreading the infection to your other eye or to other people. ?Managing discomfort ?Gently wipe away any drainage from your eye with a warm, wet washcloth or a cotton ball. ?Apply a clean, cool compress to your eye for 10-20 minutes, 3-4 times a day. ?General instructions ?Do not wear contact lenses until the inflammation is gone and your health care provider says it is safe to wear them again. Ask your health care provider how to sterilize or replace your contact lenses before you use them again. Wear glasses until you can resume wearing contact lenses. ?Avoid wearing eye makeup until the inflammation is gone. Throw away any old eye cosmetics that may be contaminated. ?Change or wash your pillowcase every day. ?Do not share towels or washcloths. This may spread the infection. ?Wash your hands often with soap and water for at least 20 seconds and especially before touching your face or eyes. Use paper towels to dry your hands. ?Avoid touching or rubbing your eyes. ?Do not drive or use heavy machinery if your vision is blurred. ?Contact   a health care provider if: ?You have a fever. ?Your symptoms do not get better after 10 days. ?Get help right away if: ?You have a fever and your symptoms suddenly get worse. ?You have severe pain when you move your eye. ?You have facial pain, redness, or swelling. ?You have a sudden loss of  vision. ?Summary ?Bacterial conjunctivitis is an infection of the clear membrane that covers the white part of the eye and the inner surface of the eyelid (conjunctiva). ?Bacterial conjunctivitis spreads easily from eye to eye and from person to person (is contagious). ?Wash your hands often with soap and water for at least 20 seconds and especially before touching your face or eyes. Use paper towels to dry your hands. ?Take or apply your antibiotic medicine as told by your health care provider. Do not stop using the antibiotic even if your condition improves. ?Contact a health care provider if you have a fever or if your symptoms do not get better after 10 days. Get help right away if you have a sudden loss of vision. ?This information is not intended to replace advice given to you by your health care provider. Make sure you discuss any questions you have with your health care provider. ?Document Revised: 09/17/2020 Document Reviewed: 09/17/2020 ?Elsevier Patient Education ? 2023 [employer] ? ?

## 2022-08-05 DIAGNOSIS — Z0000 Encounter for general adult medical examination without abnormal findings: Secondary | ICD-10-CM | POA: Diagnosis not present

## 2022-08-24 ENCOUNTER — Other Ambulatory Visit: Payer: Self-pay

## 2022-08-24 DIAGNOSIS — R609 Edema, unspecified: Secondary | ICD-10-CM

## 2022-08-24 DIAGNOSIS — J3089 Other allergic rhinitis: Secondary | ICD-10-CM

## 2022-08-24 DIAGNOSIS — R3915 Urgency of urination: Secondary | ICD-10-CM

## 2022-08-24 MED ORDER — SOLIFENACIN SUCCINATE 5 MG PO TABS: ORAL_TABLET | ORAL | 1 refills | Status: DC

## 2022-08-24 MED ORDER — FUROSEMIDE 20 MG PO TABS: 20.0000 mg | ORAL_TABLET | Freq: Every day | ORAL | 1 refills | Status: DC

## 2022-08-24 MED ORDER — CETIRIZINE HCL 10 MG PO TABS: 10.0000 mg | ORAL_TABLET | Freq: Every day | ORAL | 1 refills | Status: AC

## 2022-08-26 ENCOUNTER — Encounter: Payer: Self-pay | Admitting: Nurse Practitioner

## 2022-08-26 ENCOUNTER — Ambulatory Visit (INDEPENDENT_AMBULATORY_CARE_PROVIDER_SITE_OTHER): Payer: BC Managed Care – PPO | Admitting: Nurse Practitioner

## 2022-08-26 VITALS — BP 119/82 | HR 99 | Temp 97.8°F | Resp 20 | Ht 68.0 in | Wt 178.0 lb

## 2022-08-26 DIAGNOSIS — Z0000 Encounter for general adult medical examination without abnormal findings: Secondary | ICD-10-CM

## 2022-08-26 DIAGNOSIS — Z6827 Body mass index (BMI) 27.0-27.9, adult: Secondary | ICD-10-CM | POA: Diagnosis not present

## 2022-08-26 MED ORDER — PHENTERMINE HCL 37.5 MG PO CAPS: 37.5000 mg | ORAL_CAPSULE | ORAL | 2 refills | Status: DC

## 2022-08-26 NOTE — Patient Instructions (Signed)
Exercising to Stay Healthy To become healthy and stay healthy, it is recommended that you do moderate-intensity and vigorous-intensity exercise. You can tell that you are exercising at a moderate intensity if your heart starts beating faster and you start breathing faster but can still hold a conversation. You can tell that you are exercising at a vigorous intensity if you are breathing much harder and faster and cannot hold a conversation while exercising. How can exercise benefit me? Exercising regularly is important. It has many health benefits, such as: Improving overall fitness, flexibility, and endurance. Increasing bone density. Helping with weight control. Decreasing body fat. Increasing muscle strength and endurance. Reducing stress and tension, anxiety, depression, or anger. Improving overall health. What guidelines should I follow while exercising? Before you start a new exercise program, talk with your health care provider. Do not exercise so much that you hurt yourself, feel dizzy, or get very short of breath. Wear comfortable clothes and wear shoes with good support. Drink plenty of water while you exercise to prevent dehydration or heat stroke. Work out until your breathing and your heartbeat get faster (moderate intensity). How often should I exercise? Choose an activity that you enjoy, and set realistic goals. Your health care provider can help you make an activity plan that is individually designed and works best for you. Exercise regularly as told by your health care provider. This may include: Doing strength training two times a week, such as: Lifting weights. Using resistance bands. Push-ups. Sit-ups. Yoga. Doing a certain intensity of exercise for a given amount of time. Choose from these options: A total of 150 minutes of moderate-intensity exercise every week. A total of 75 minutes of vigorous-intensity exercise every week. A mix of moderate-intensity and  vigorous-intensity exercise every week. Children, pregnant women, people who have not exercised regularly, people who are overweight, and older adults may need to talk with a health care provider about what activities are safe to perform. If you have a medical condition, be sure to talk with your health care provider before you start a new exercise program. What are some exercise ideas? Moderate-intensity exercise ideas include: Walking 1 mile (1.6 km) in about 15 minutes. Biking. Hiking. Golfing. Dancing. Water aerobics. Vigorous-intensity exercise ideas include: Walking 4.5 miles (7.2 km) or more in about 1 hour. Jogging or running 5 miles (8 km) in about 1 hour. Biking 10 miles (16.1 km) or more in about 1 hour. Lap swimming. Roller-skating or in-line skating. Cross-country skiing. Vigorous competitive sports, such as football, basketball, and soccer. Jumping rope. Aerobic dancing. What are some everyday activities that can help me get exercise? Yard work, such as: Pushing a lawn mower. Raking and bagging leaves. Washing your car. Pushing a stroller. Shoveling snow. Gardening. Washing windows or floors. How can I be more active in my day-to-day activities? Use stairs instead of an elevator. Take a walk during your lunch break. If you drive, park your car farther away from your work or school. If you take public transportation, get off one stop early and walk the rest of the way. Stand up or walk around during all of your indoor phone calls. Get up, stretch, and walk around every 30 minutes throughout the day. Enjoy exercise with a friend. Support to continue exercising will help you keep a regular routine of activity. Where to find more information You can find more information about exercising to stay healthy from: U.S. Department of Health and Human Services: www.hhs.gov Centers for Disease Control and Prevention (  CDC): www.cdc.gov Summary Exercising regularly is  important. It will improve your overall fitness, flexibility, and endurance. Regular exercise will also improve your overall health. It can help you control your weight, reduce stress, and improve your bone density. Do not exercise so much that you hurt yourself, feel dizzy, or get very short of breath. Before you start a new exercise program, talk with your health care provider. This information is not intended to replace advice given to you by your health care provider. Make sure you discuss any questions you have with your health care provider. Document Revised: 10/03/2020 Document Reviewed: 10/03/2020 Elsevier Patient Education  2023 Elsevier Inc.  

## 2022-08-26 NOTE — Addendum Note (Signed)
Addended by: Chevis Pretty on: 08/26/2022 02:23 PM   Modules accepted: Orders

## 2022-08-26 NOTE — Progress Notes (Signed)
Subjective:    Patient ID: Haley Allen, female    DOB: 1981/04/17, 42 y.o.   MRN: [id number]    Chief Complaint: Annual Exam    HPI:  Haley Allen is a 42 y.o. who identifies as a female who was assigned female at birth.   Social history: Lives with: husband and son Work history: work [employer] in today for follow up of the following chronic medical issues:  1. Annual physical exam No isuues  today   New complaints: None today  Allergies  Allergen Reactions   Latex Other (See Comments)    Skin redness   Outpatient Encounter Medications as of 08/26/2022  Medication Sig   cetirizine (ZYRTEC) 10 MG tablet Take 1 tablet (10 mg total) by mouth daily.   fluticasone (FLONASE) 50 MCG/ACT nasal spray Place 2 sprays into both nostrils daily as needed for allergies or rhinitis.   furosemide (LASIX) 20 MG tablet Take 1 tablet (20 mg total) by mouth daily.   Multiple Vitamin (MULTIVITAMIN PO) Take by mouth.   phentermine 37.5 MG capsule Take 1 capsule (37.5 mg total) by mouth every morning.   solifenacin (VESICARE) 5 MG tablet TAKE 1 TABLET BY MOUTH EVERYDAY AT BEDTIME   [DISCONTINUED] trimethoprim-polymyxin b (POLYTRIM) ophthalmic solution Place 2 drops into both eyes every 4 (four) hours.   No facility-administered encounter medications on file as of 08/26/2022.    Past Surgical History:  Procedure Laterality Date   ANTERIOR CERVICAL DECOMP/DISCECTOMY FUSION N/A 06/12/2021   Procedure: C5-6, C6-7 ANTERIOR CERVICAL DISCECTOMY FUSION, ALLOGRAFT, PLATE;  Surgeon: Marybelle Killings, MD;  Location: [facility];  Service: Orthopedics;  Laterality: N/A;   WISDOM TOOTH EXTRACTION      Family History  Problem Relation Age of Onset   Hypertension Mother    Diabetes Mother    Depression Mother    Cancer Father 22       renal   Renal cancer Father    Diabetes Maternal Grandmother    Leukemia Paternal Grandfather    Prostate cancer Neg Hx    Colon cancer Neg Hx        Controlled substance contract: n/a   Review of Systems  Constitutional:  Negative for diaphoresis.  Eyes:  Negative for pain.  Respiratory:  Negative for shortness of breath.   Cardiovascular:  Negative for chest pain, palpitations and leg swelling.  Gastrointestinal:  Negative for abdominal pain.  Endocrine: Negative for polydipsia.  Skin:  Negative for rash.  Neurological:  Negative for dizziness, weakness and headaches.  Hematological:  Does not bruise/bleed easily.  All other systems reviewed and are negative.      Objective:   Physical Exam Vitals and nursing note reviewed.  Constitutional:      General: She is not in acute distress.    Appearance: Normal appearance. She is well-developed.  HENT:     Head: Normocephalic.     Right Ear: Tympanic membrane normal.     Left Ear: Tympanic membrane normal.     Nose: Nose normal.     Mouth/Throat:     Mouth: Mucous membranes are moist.  Eyes:     Pupils: Pupils are equal, round, and reactive to light.  Neck:     Vascular: No carotid bruit or JVD.  Cardiovascular:     Rate and Rhythm: Normal rate and regular rhythm.     Heart sounds: Normal heart sounds.  Pulmonary:     Effort: Pulmonary effort is normal.  No respiratory distress.     Breath sounds: Normal breath sounds. No wheezing or rales.  Chest:     Chest wall: No tenderness.  Abdominal:     General: Bowel sounds are normal. There is no distension or abdominal bruit.     Palpations: Abdomen is soft. There is no hepatomegaly, splenomegaly, mass or pulsatile mass.     Tenderness: There is no abdominal tenderness.  Musculoskeletal:        General: Normal range of motion.     Cervical back: Normal range of motion and neck supple.  Lymphadenopathy:     Cervical: No cervical adenopathy.  Skin:    General: Skin is warm and dry.  Neurological:     Mental Status: She is alert and oriented to person, place, and time.     Deep Tendon Reflexes: Reflexes are  normal and symmetric.  Psychiatric:        Behavior: Behavior normal.        Thought Content: Thought content normal.        Judgment: Judgment normal.     BP 119/82   Pulse 99   Temp 97.8 F (36.6 C) (Temporal)   Resp 20   Ht '5\' 8"'$  (1.727 m)   Wt 178 lb (80.7 kg)   SpO2 96%   BMI 27.06 kg/m        Assessment & Plan:   [name] in today with chief complaint of Annual Exam   1. Annual physical exam Stress management - CBC with Differential/Platelet - CMP14+EGFR - Lipid panel - Thyroid Panel With TSH - VITAMIN D 25 Hydroxy (Vit-D Deficiency, Fractures)    The above assessment and management plan was discussed with the patient. The patient verbalized understanding of and has agreed to the management plan. Patient is aware to call the clinic if symptoms persist or worsen. Patient is aware when to return to the clinic for a follow-up visit. Patient educated on when it is appropriate to go to the emergency department.   [name] Hassell Done, FNP

## 2022-08-27 LAB — CBC WITH DIFFERENTIAL/PLATELET
Basophils Absolute: 0.1 10*3/uL (ref 0.0–0.2)
Basos: 1 %
EOS (ABSOLUTE): 0.3 10*3/uL (ref 0.0–0.4)
Eos: 5 %
Hematocrit: 42.8 % (ref 34.0–46.6)
Hemoglobin: 14.4 g/dL (ref 11.1–15.9)
Immature Grans (Abs): 0 10*3/uL (ref 0.0–0.1)
Immature Granulocytes: 0 %
Lymphocytes Absolute: 2.1 10*3/uL (ref 0.7–3.1)
Lymphs: 29 %
MCH: 31.7 pg (ref 26.6–33.0)
MCHC: 33.6 g/dL (ref 31.5–35.7)
MCV: 94 fL (ref 79–97)
Monocytes Absolute: 0.6 10*3/uL (ref 0.1–0.9)
Monocytes: 8 %
Neutrophils Absolute: 4 10*3/uL (ref 1.4–7.0)
Neutrophils: 57 %
Platelets: 351 10*3/uL (ref 150–450)
RBC: 4.54 x10E6/uL (ref 3.77–5.28)
RDW: 13 % (ref 11.7–15.4)
WBC: 7.1 10*3/uL (ref 3.4–10.8)

## 2022-08-27 LAB — CMP14+EGFR
ALT: 14 IU/L (ref 0–32)
AST: 25 IU/L (ref 0–40)
Albumin/Globulin Ratio: 1.6 (ref 1.2–2.2)
Albumin: 4.4 g/dL (ref 3.9–4.9)
Alkaline Phosphatase: 70 IU/L (ref 44–121)
BUN/Creatinine Ratio: 15 (ref 9–23)
BUN: 14 mg/dL (ref 6–24)
Bilirubin Total: 0.5 mg/dL (ref 0.0–1.2)
CO2: 23 mmol/L (ref 20–29)
Calcium: 9.8 mg/dL (ref 8.7–10.2)
Chloride: 99 mmol/L (ref 96–106)
Creatinine, Ser: 0.95 mg/dL (ref 0.57–1.00)
Globulin, Total: 2.7 g/dL (ref 1.5–4.5)
Glucose: 87 mg/dL (ref 70–99)
Potassium: 5.1 mmol/L (ref 3.5–5.2)
Sodium: 137 mmol/L (ref 134–144)
Total Protein: 7.1 g/dL (ref 6.0–8.5)
eGFR: 76 mL/min/{1.73_m2} (ref >59)

## 2022-08-27 LAB — LIPID PANEL
Chol/HDL Ratio: 3.7 ratio (ref 0.0–4.4)
Cholesterol, Total: 191 mg/dL (ref 100–199)
HDL: 52 mg/dL (ref >39)
LDL Chol Calc (NIH): 123 mg/dL — ABNORMAL HIGH (ref 0–99)
Triglycerides: 86 mg/dL (ref 0–149)
VLDL Cholesterol Cal: 16 mg/dL (ref 5–40)

## 2022-08-27 LAB — THYROID PANEL WITH TSH
Free Thyroxine Index: 2 (ref 1.2–4.9)
T3 Uptake Ratio: 29 % (ref 24–39)
T4, Total: 6.8 ug/dL (ref 4.5–12.0)
TSH: 4.32 u[IU]/mL (ref 0.450–4.500)

## 2022-08-27 LAB — VITAMIN D 25 HYDROXY (VIT D DEFICIENCY, FRACTURES): Vit D, 25-Hydroxy: 57.7 ng/mL (ref 30.0–100.0)

## 2022-08-31 ENCOUNTER — Other Ambulatory Visit: Payer: Self-pay | Admitting: Nurse Practitioner

## 2022-08-31 DIAGNOSIS — Z6827 Body mass index (BMI) 27.0-27.9, adult: Secondary | ICD-10-CM

## 2022-09-05 IMAGING — RF DG CERVICAL SPINE 2 OR 3 VIEWS
1 series · 2 of 2 positions shown · non-contrast
Comparison: None.

CLINICAL DATA: ACDF.

EXAM:
CERVICAL SPINE - 2-3 VIEW

[Series 1: run · 2 of 2 slices shown]
[im 1/2]
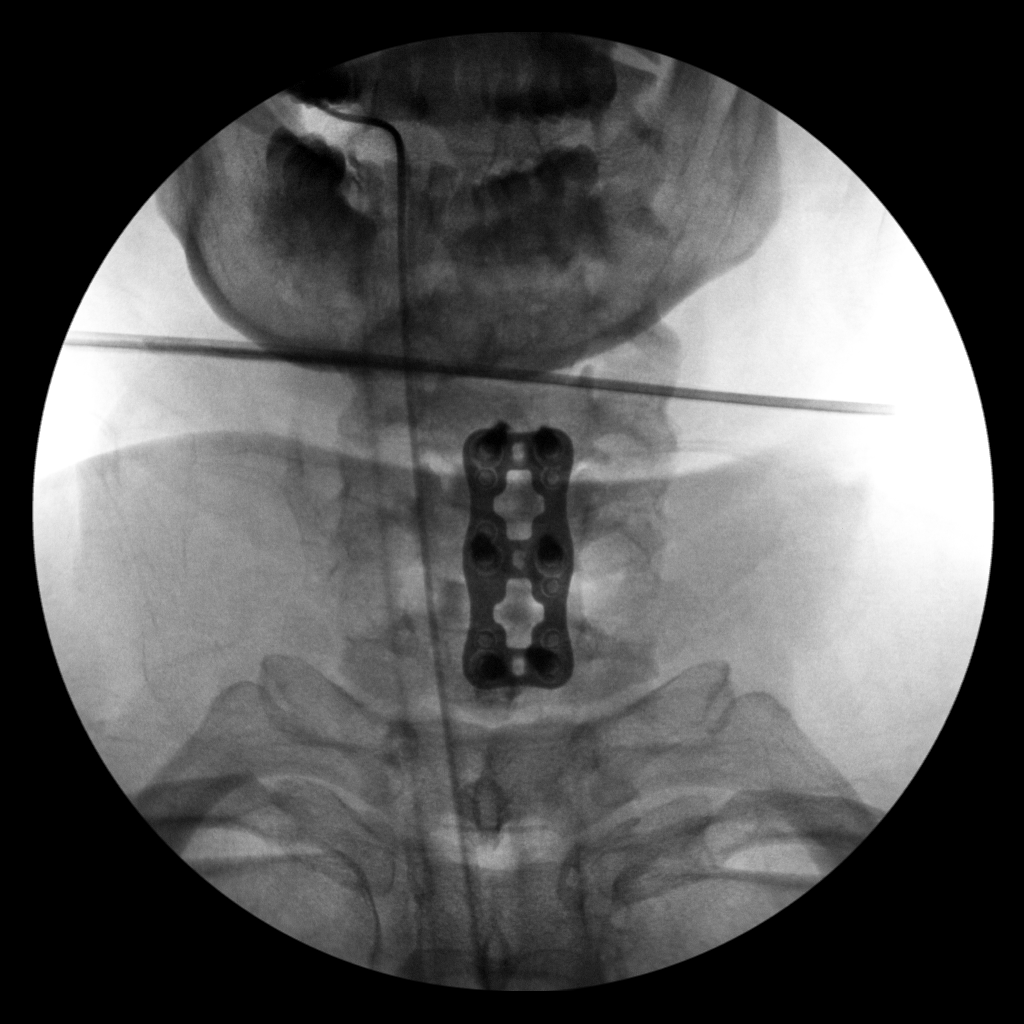
[im 2/2]
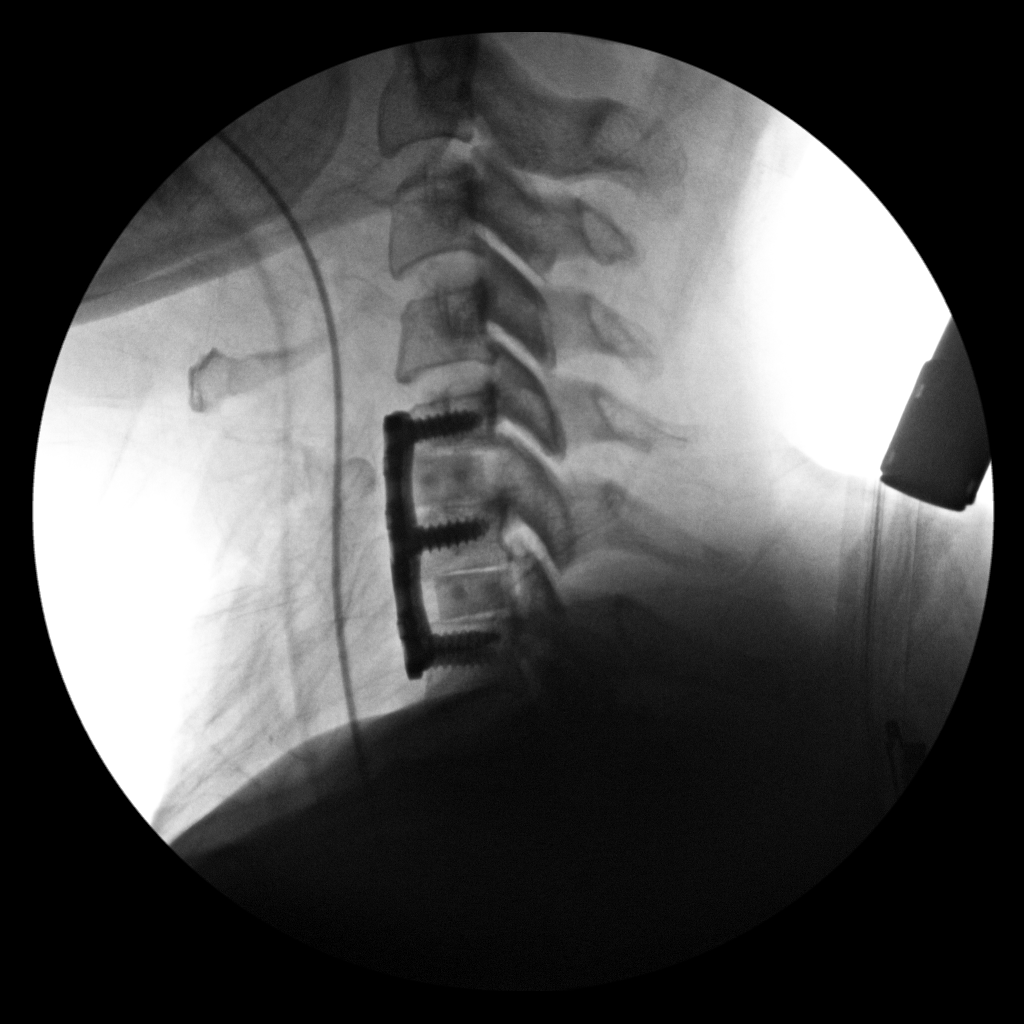

[2 of 2 positions shown; findings below may reference images not displayed]

FINDINGS: Multiple fluoroscopic images during ACDF at C5-C6 and C6-C7. Total
fluoroscopic time was 22 seconds. Total fluoroscopic dose was
mGy.
IMPRESSION: ACDF at C5-C6 and C6-C7 with intact hardware and disc spacer devices
in place.

## 2022-10-01 DIAGNOSIS — Z1231 Encounter for screening mammogram for malignant neoplasm of breast: Secondary | ICD-10-CM | POA: Diagnosis not present

## 2022-11-26 ENCOUNTER — Ambulatory Visit: Payer: BC Managed Care – PPO | Admitting: Nurse Practitioner

## 2022-11-26 ENCOUNTER — Encounter: Payer: Self-pay | Admitting: Nurse Practitioner

## 2022-11-26 DIAGNOSIS — Z6827 Body mass index (BMI) 27.0-27.9, adult: Secondary | ICD-10-CM

## 2022-11-26 MED ORDER — PHENTERMINE HCL 37.5 MG PO CAPS: 37.5000 mg | ORAL_CAPSULE | ORAL | 2 refills | Status: DC

## 2022-11-26 NOTE — Progress Notes (Signed)
Subjective:    Patient ID: Haley Allen, female    DOB: 1981-01-05, 42 y.o.   MRN: [id number]   Chief Complaint: weight  HPI  Patient has been on adipex for weight loss and is here today for refill. Wt Readings from Last 3 Encounters:  11/26/22 177 lb (80.3 kg)  08/26/22 178 lb (80.7 kg)  06/07/22 182 lb (82.6 kg)   BMI Readings from Last 3 Encounters:  11/26/22 26.91 kg/m  08/26/22 27.06 kg/m  06/07/22 27.67 kg/m     Patient Active Problem List   Diagnosis Date Noted   Peripheral edema 02/25/2022   Seasonal allergic rhinitis due to pollen 02/05/2022   Urinary urgency 02/05/2022   BMI 27.0-27.9,adult 02/05/2022   Gamekeeper's thumb, right 10/15/2021   Cervical spinal stenosis 06/12/2021   Smoker 06/30/2017       Review of Systems  Constitutional:  Negative for diaphoresis.  Eyes:  Negative for pain.  Respiratory:  Negative for shortness of breath.   Cardiovascular:  Negative for chest pain, palpitations and leg swelling.  Gastrointestinal:  Negative for abdominal pain.  Endocrine: Negative for polydipsia.  Skin:  Negative for rash.  Neurological:  Negative for dizziness, weakness and headaches.  Hematological:  Does not bruise/bleed easily.  All other systems reviewed and are negative.      Objective:   Physical Exam Vitals and nursing note reviewed.  Constitutional:      General: She is not in acute distress.    Appearance: Normal appearance. She is well-developed.  HENT:     Head: Normocephalic.     Right Ear: Tympanic membrane normal.     Left Ear: Tympanic membrane normal.     Nose: Nose normal.     Mouth/Throat:     Mouth: Mucous membranes are moist.  Eyes:     Pupils: Pupils are equal, round, and reactive to light.  Neck:     Vascular: No carotid bruit or JVD.  Cardiovascular:     Rate and Rhythm: Normal rate and regular rhythm.     Heart sounds: Normal heart sounds.  Pulmonary:     Effort: Pulmonary effort is normal. No respiratory  distress.     Breath sounds: Normal breath sounds. No wheezing or rales.  Chest:     Chest wall: No tenderness.  Abdominal:     General: Bowel sounds are normal. There is no distension or abdominal bruit.     Palpations: Abdomen is soft. There is no hepatomegaly, splenomegaly, mass or pulsatile mass.     Tenderness: There is no abdominal tenderness.  Musculoskeletal:        General: Normal range of motion.     Cervical back: Normal range of motion and neck supple.  Lymphadenopathy:     Cervical: No cervical adenopathy.  Skin:    General: Skin is warm and dry.  Neurological:     Mental Status: She is alert and oriented to person, place, and time.     Deep Tendon Reflexes: Reflexes are normal and symmetric.  Psychiatric:        Behavior: Behavior normal.        Thought Content: Thought content normal.        Judgment: Judgment normal.    BP 112/82   Pulse (!) 101   Temp 98 F (36.7 C) (Temporal)   Resp 20   Ht 5\' 8"  (1.727 m)   Wt 177 lb (80.3 kg)   SpO2 99%   BMI 26.91 kg/m  Assessment & Plan:   Haley Allen in today with chief complaint of Medical Management of Chronic Issues   1. BMI 27.0-27.9,adult Low fat diet and exercise Encouraged patient to take a break from meds for at least a month - phentermine 37.5 MG capsule; Take 1 capsule (37.5 mg total) by mouth every morning.  Dispense: 30 capsule; Refill: 2    The above assessment and management plan was discussed with the patient. The patient verbalized understanding of and has agreed to the management plan. Patient is aware to call the clinic if symptoms persist or worsen. Patient is aware when to return to the clinic for a follow-up visit. Patient educated on when it is appropriate to go to the emergency department.   [name], FNP

## 2022-11-29 ENCOUNTER — Telehealth: Payer: Self-pay

## 2022-11-29 ENCOUNTER — Other Ambulatory Visit: Payer: Self-pay | Admitting: Nurse Practitioner

## 2022-11-29 ENCOUNTER — Telehealth (INDEPENDENT_AMBULATORY_CARE_PROVIDER_SITE_OTHER): Payer: BC Managed Care – PPO | Admitting: Nurse Practitioner

## 2022-11-29 ENCOUNTER — Encounter: Payer: Self-pay | Admitting: Nurse Practitioner

## 2022-11-29 DIAGNOSIS — J029 Acute pharyngitis, unspecified: Secondary | ICD-10-CM | POA: Diagnosis not present

## 2022-11-29 DIAGNOSIS — Z6827 Body mass index (BMI) 27.0-27.9, adult: Secondary | ICD-10-CM

## 2022-11-29 MED ORDER — CEFDINIR 300 MG PO CAPS: 300.0000 mg | ORAL_CAPSULE | Freq: Two times a day (BID) | ORAL | 0 refills | Status: DC

## 2022-11-29 NOTE — Telephone Encounter (Signed)
Video visit scheduled.

## 2022-11-29 NOTE — Patient Instructions (Signed)
  Arnette Felts, thank you for joining Bennie Pierini, FNP for today's virtual visit.  While this provider is not your primary care provider (PCP), if your PCP is located in our provider database this encounter information will be shared with them immediately following your visit.   A [facility] MyChart account gives you access to today's visit and all your visits, tests, and labs performed at [facility] " click here if you don't have a [facility] MyChart account or go to [email or web address]  Consent: (Patient) Haley Allen provided verbal consent for this virtual visit at the beginning of the encounter.  Current Medications:  Current Outpatient Medications:    cefdinir (OMNICEF) 300 MG capsule, Take 1 capsule (300 mg total) by mouth 2 (two) times daily. 1 po BID, Disp: 20 capsule, Rfl: 0   cetirizine (ZYRTEC) 10 MG tablet, Take 1 tablet (10 mg total) by mouth daily., Disp: 90 tablet, Rfl: 1   fluticasone (FLONASE) 50 MCG/ACT nasal spray, Place 2 sprays into both nostrils daily as needed for allergies or rhinitis., Disp: 16 g, Rfl: 5   furosemide (LASIX) 20 MG tablet, Take 1 tablet (20 mg total) by mouth daily., Disp: 90 tablet, Rfl: 1   Multiple Vitamin (MULTIVITAMIN PO), Take by mouth., Disp: , Rfl:    phentermine 37.5 MG capsule, Take 1 capsule (37.5 mg total) by mouth every morning., Disp: 30 capsule, Rfl: 2   solifenacin (VESICARE) 5 MG tablet, TAKE 1 TABLET BY MOUTH EVERYDAY AT BEDTIME, Disp: 90 tablet, Rfl: 1   Medications ordered in this encounter:  Meds ordered this encounter  Medications   cefdinir (OMNICEF) 300 MG capsule    Sig: Take 1 capsule (300 mg total) by mouth 2 (two) times daily. 1 po BID    Dispense:  20 capsule    Refill:  0    Order Specific Question:   Supervising Provider    Answer:   [facility] Care A [[id number]]     *If you need refills on other medications prior to your next appointment, please contact your  pharmacy*  Follow-Up: Call back or seek an in-person evaluation if the symptoms worsen or if the condition fails to improve as anticipated.  [facility] [phone]  Other Instructions Force fluids Motrin or tylenol OTC OTC decongestant Throat lozenges if help New toothbrush in 3 days    If you have been instructed to have an in-person evaluation today at a local Urgent Care facility, please use the link below. It will take you to a list of all of our available [facility], including address, phone number and hours of operation. Please do not delay care.  [facility]  If you or a family member do not have a primary care provider, use the link below to schedule a visit and establish care. When you choose a [facility] primary care physician or advanced practice provider, you gain a long-term partner in health. Find a Primary Care Provider  Learn more about [facility]'s in-office and virtual care options: [facility] - Get Care Now

## 2022-11-29 NOTE — Telephone Encounter (Signed)
Please schedule for video visit.

## 2022-11-29 NOTE — Telephone Encounter (Signed)
Patient has white patches in throat and is running a fever. Wants antibiotic sent in. Please review

## 2022-11-29 NOTE — Progress Notes (Signed)
Virtual Visit Consent   Haley Allen, you are scheduled for a virtual visit with [name], FNP, a [facility] provider, today.     Just as with appointments in the office, your consent must be obtained to participate.  Your consent will be active for this visit and any virtual visit you may have with one of our providers in the next 365 days.     If you have a MyChart account, a copy of this consent can be sent to you electronically.  All virtual visits are billed to your insurance company just like a traditional visit in the office.    As this is a virtual visit, video technology does not allow for your provider to perform a traditional examination.  This may limit your provider's ability to fully assess your condition.  If your provider identifies any concerns that need to be evaluated in person or the need to arrange testing (such as labs, EKG, etc.), we will make arrangements to do so.     Although advances in technology are sophisticated, we cannot ensure that it will always work on either your end or our end.  If the connection with a video visit is poor, the visit may have to be switched to a telephone visit.  With either a video or telephone visit, we are not always able to ensure that we have a secure connection.     I need to obtain your verbal consent now.   Are you willing to proceed with your visit today? YES   Haley Allen has provided verbal consent on 11/29/2022 for a virtual visit (video or telephone).   [name], FNP   Date: 11/29/2022 2:37 PM   Virtual Visit via Video Note   I, [name], connected with Haley Allen ([id number], 1980-08-07) on 11/29/22 at  2:30 PM EDT by a video-enabled telemedicine application and verified that I am speaking with the correct person using two identifiers.  Location: Patient: Virtual Visit Location Patient: Home Provider: Virtual Visit Location Provider: Mobile   I discussed the limitations of  evaluation and management by telemedicine and the availability of in person appointments. The patient expressed understanding and agreed to proceed.    History of Present Illness: Haley Allen is a 41 y.o. who identifies as a female who was assigned female at birth, and is being seen today for pharyngitis.  HPI: Sore Throat  This is a new problem. The current episode started in the past 7 days. The problem has been waxing and waning. Neither side of throat is experiencing more pain than the other. The maximum temperature recorded prior to her arrival was 100.4 - 100.9 F. The fever has been present for 1 to 2 days. The pain is at a severity of 7/10. Associated symptoms include congestion, swollen glands and trouble swallowing. She has had no exposure to strep. She has tried acetaminophen for the symptoms. The treatment provided mild relief.    Review of Systems  HENT:  Positive for congestion and trouble swallowing.     Problems:  Patient Active Problem List   Diagnosis Date Noted   Peripheral edema 02/25/2022   Seasonal allergic rhinitis due to pollen 02/05/2022   Urinary urgency 02/05/2022   BMI 27.0-27.9,adult 02/05/2022   Gamekeeper's thumb, right 10/15/2021   Cervical spinal stenosis 06/12/2021   Smoker 06/30/2017    Allergies:  Allergies  Allergen Reactions   Latex Other (See Comments)    Skin redness   Medications:  Current Outpatient  Medications:    cefdinir (OMNICEF) 300 MG capsule, Take 1 capsule (300 mg total) by mouth 2 (two) times daily. 1 po BID, Disp: 20 capsule, Rfl: 0   cetirizine (ZYRTEC) 10 MG tablet, Take 1 tablet (10 mg total) by mouth daily., Disp: 90 tablet, Rfl: 1   fluticasone (FLONASE) 50 MCG/ACT nasal spray, Place 2 sprays into both nostrils daily as needed for allergies or rhinitis., Disp: 16 g, Rfl: 5   furosemide (LASIX) 20 MG tablet, Take 1 tablet (20 mg total) by mouth daily., Disp: 90 tablet, Rfl: 1   Multiple Vitamin (MULTIVITAMIN PO), Take by  mouth., Disp: , Rfl:    phentermine 37.5 MG capsule, Take 1 capsule (37.5 mg total) by mouth every morning., Disp: 30 capsule, Rfl: 2   solifenacin (VESICARE) 5 MG tablet, TAKE 1 TABLET BY MOUTH EVERYDAY AT BEDTIME, Disp: 90 tablet, Rfl: 1  Observations/Objective: Patient is well-developed, well-nourished in no acute distress.  Resting comfortably  at home.  Head is normocephalic, atraumatic.  No labored breathing.  Speech is clear and coherent with logical content.  Patient is alert and oriented at baseline.  Erythematous posterior oral pharynx Knots left side  of neck  Assessment and Plan:  Haley Allen in today with chief complaint of No chief complaint on file.   1. Pharyngitis, unspecified etiology Force fluids Motrin or tylenol OTC OTC decongestant Throat lozenges if help New toothbrush in 3 days  Meds ordered this encounter  Medications   cefdinir (OMNICEF) 300 MG capsule    Sig: Take 1 capsule (300 mg total) by mouth 2 (two) times daily. 1 po BID    Dispense:  20 capsule    Refill:  0    Order Specific Question:   Supervising Provider    Answer:   Arville Care A [[id number]]     Follow Up Instructions: I discussed the assessment and treatment plan with the patient. The patient was provided an opportunity to ask questions and all were answered. The patient agreed with the plan and demonstrated an understanding of the instructions.  A copy of instructions were sent to the patient via MyChart.  The patient was advised to call back or seek an in-person evaluation if the symptoms worsen or if the condition fails to improve as anticipated.  Time:  I spent 6 minutes with the patient via telehealth technology discussing the above problems/concerns.    [name], FNP

## 2023-02-24 ENCOUNTER — Ambulatory Visit: Payer: BC Managed Care – PPO | Admitting: Nurse Practitioner

## 2023-02-24 ENCOUNTER — Encounter: Payer: Self-pay | Admitting: Nurse Practitioner

## 2023-02-24 ENCOUNTER — Telehealth: Payer: Self-pay | Admitting: Nurse Practitioner

## 2023-02-24 VITALS — BP 122/84 | HR 100 | Temp 98.5°F | Resp 20 | Ht 68.0 in | Wt 180.0 lb

## 2023-02-24 DIAGNOSIS — S161XXA Strain of muscle, fascia and tendon at neck level, initial encounter: Secondary | ICD-10-CM

## 2023-02-24 MED ORDER — CYCLOBENZAPRINE HCL 10 MG PO TABS
10.0000 mg | ORAL_TABLET | Freq: Three times a day (TID) | ORAL | 1 refills | Status: AC | PRN
Start: 2023-02-24 — End: ?

## 2023-02-24 MED ORDER — METHYLPREDNISOLONE ACETATE 80 MG/ML IJ SUSP
80.0000 mg | Freq: Once | INTRAMUSCULAR | Status: AC
Start: 2023-02-24 — End: 2023-02-24
  Administered 2023-02-24: 80 mg via INTRAMUSCULAR

## 2023-02-24 MED ORDER — PREDNISONE 20 MG PO TABS
ORAL_TABLET | ORAL | 0 refills | Status: DC
Start: 2023-02-24 — End: 2023-03-14

## 2023-02-24 NOTE — Progress Notes (Signed)
   Subjective:    Patient ID: Haley Allen, female    DOB: 07-Apr-1981, 42 y.o.   MRN: [id number]   Chief Complaint: Neck Pain (Swelling for 1 week/)   Neck Pain  This is a new problem. The current episode started in the past 7 days. The problem occurs constantly. The problem has been waxing and waning. The pain is associated with nothing. The pain is present in the occipital region. The quality of the pain is described as cramping and stabbing. The pain is at a severity of 8/10. The pain is moderate. The symptoms are aggravated by twisting. The pain is Same all the time. Pertinent negatives include no fever, headaches, numbness or pain with swallowing. She has tried acetaminophen, muscle relaxants and ice for the symptoms. The treatment provided mild relief.    Patient Active Problem List   Diagnosis Date Noted   Peripheral edema 02/25/2022   Seasonal allergic rhinitis due to pollen 02/05/2022   Urinary urgency 02/05/2022   BMI 27.0-27.9,adult 02/05/2022   Gamekeeper's thumb, right 10/15/2021   Cervical spinal stenosis 06/12/2021   Smoker 06/30/2017       Review of Systems  Constitutional:  Negative for fever.  Musculoskeletal:  Positive for neck pain.  Neurological:  Negative for numbness and headaches.       Objective:   Physical Exam Vitals reviewed.  Constitutional:      Appearance: Normal appearance.  Cardiovascular:     Rate and Rhythm: Normal rate and regular rhythm.     Heart sounds: Normal heart sounds.  Pulmonary:     Effort: Pulmonary effort is normal.     Breath sounds: Normal breath sounds.  Musculoskeletal:     Comments: Decreased ROM of cervical spine with pain on flexion and rotation Grips equal bil  Skin:    General: Skin is warm.  Neurological:     General: No focal deficit present.     Mental Status: She is alert and oriented to person, place, and time.  Psychiatric:        Mood and Affect: Mood normal.        Behavior: Behavior normal.      BP 122/84   Pulse 100   Temp 98.5 F (36.9 C) (Temporal)   Resp 20   Ht 5\' 8"  (1.727 m)   Wt 180 lb (81.6 kg)   SpO2 100%   BMI 27.37 kg/m        Assessment & Plan:   Haley Allen in today with chief complaint of Neck Pain (Swelling for 1 week/)   1. Strain of neck muscle, initial encounter Moist heat Rest  sedation precautions with flexeril RTO prn - methylPREDNISolone acetate (DEPO-MEDROL) injection 80 mg - predniSONE (DELTASONE) 20 MG tablet; 2 po at sametime daily for 5 days-  Dispense: 10 tablet; Refill: 0 - cyclobenzaprine (FLEXERIL) 10 MG tablet; Take 1 tablet (10 mg total) by mouth 3 (three) times daily as needed for muscle spasms.  Dispense: 30 tablet; Refill: 1    The above assessment and management plan was discussed with the patient. The patient verbalized understanding of and has agreed to the management plan. Patient is aware to call the clinic if symptoms persist or worsen. Patient is aware when to return to the clinic for a follow-up visit. Patient educated on when it is appropriate to go to the emergency department.   [name], FNP

## 2023-02-24 NOTE — Telephone Encounter (Signed)
Pt called requesting to speak with [facility] nurse regarding neck pain. Offered to make her an appt but pt said she didn't know if she needed an appt.

## 2023-02-24 NOTE — Patient Instructions (Signed)
Cervical Sprain A cervical sprain is a stretch or tear in one or more of the ligaments in the neck. Ligaments are the tissues that connect bones to each other. Cervical sprains can range from mild to severe. Severe cervical sprains can cause the spinal bones (vertebrae) in the neck to be unstable. This can result in spinal cord damage and serious nervous system problems. Healing time for a cervical sprain depends on the cause and extent of the injury. Most cervical sprains heal in 4-6 weeks. What are the causes? Cervical sprains may be caused by trauma, such as an injury from a motor vehicle accident, a fall, or a sudden forward and backward whipping movement of the head and neck (whiplash injury). Mild cervical sprains may be caused by wear and tear over time. What increases the risk? You are more likely to get a cervical sprain if: You take part in activities that have a high risk of trauma to the neck. These include contact sports, gymnastics, and diving. You have: Osteoarthritis of the spine. Poor strength and flexibility of the neck. Poor posture. You have had a neck injury in the past. You spend long periods in positions that put stress on the neck, such as sitting at a computer. What are the signs or symptoms? Symptoms of this condition include: Any of these problems in the neck, shoulders, or upper back: Pain or tenderness. Stiffness. Swelling. A burning feeling. Sudden tightening of neck muscles (spasms). Limited ability to move the neck. Headache. Dizziness. Nausea or vomiting. Weakness, numbness, or tingling in a hand or an arm. Symptoms may develop right away after injury or may develop over a few days. In some cases, symptoms may go away with treatment and return (recur) over time. How is this diagnosed? This condition may be diagnosed based on: Your symptoms, medical history, and a physical exam. Any recent injuries or known neck problems that you have, such as arthritis  in the neck. Imaging tests, such as X-rays, an MRI, or a CT scan. How is this treated? This condition is treated by resting and icing the injured area and doing physical therapy exercises to improve movement and strength. Heat therapy may be used 2-3 days after the injury if there is no swelling. Depending on the severity of your condition, treatment may also include: Keeping your neck in place (immobilized) for periods of time. This may be done using: A cervical collar. This supports your chin and the back of your head. A cervical traction device. This is a sling that holds up your head. It removes weight and pressure from your neck. Medicines for pain or other symptoms. Surgery. This is rare. Follow these instructions at home: Medicines Take over-the-counter and prescription medicines only as told by your health care provider. Ask your provider if the medicine prescribed to you: Requires you to avoid driving or using machinery. Can cause constipation. You may need to take these actions to prevent or treat constipation: Drink enough fluid to keep your pee pale yellow. Take over-the-counter or prescription medicines. Eat foods that are high in fiber, such as beans, whole grains, and fresh fruits and vegetables. Limit foods that are high in fat and processed sugars, such as fried or sweet foods. If you have a cervical collar: Wear the collar as told by your provider. Do not remove it unless told. Ask before making any adjustments to your collar. If you have long hair, keep it outside of the collar. If you are allowed to remove the   collar for cleaning and bathing: Follow instructions about how to remove it safely. Clean it by hand with mild soap and water and air-dry it completely. If your collar has removable pads, remove them every 1-2 days and wash them by hand with soap and water. Let them air-dry completely before putting them back in the collar. Tell your provider if your skin under  the collar has irritation or sores. Managing pain, stiffness, and swelling     Use a cervical traction device as told. If told, put ice on the affected area. Put ice in a plastic bag. Place a towel between your skin and the bag. Leave the ice on for 20 minutes, 2-3 times a day. If told, apply heat to the affected area before you exercise or as often as told by your provider. Use the heat source that your provider recommends, such as a moist heat pack or a heating pad. Place a towel between your skin and the heat source. Leave the heat on for 20-30 minutes. If your skin turns bright red, remove the ice or heat right away to prevent skin damage. The risk of damage is higher if you cannot feel pain, heat, or cold. Activity Do not drive while wearing a cervical collar. If you do not have a cervical collar, ask if it is safe to drive while your neck heals. Do not lift anything that is heavier than 10 lb (4.5 kg) until your provider says that it is safe. Rest as told by your provider. Avoid positions and activities that make your symptoms worse. Do physical therapy exercises as told by your provider or physical therapist. Return to your normal activities as told by your provider. Ask your provider what activities are safe for you. General instructions Do not use any products that contain nicotine or tobacco. These products include cigarettes, chewing tobacco, and vaping devices, such as e-cigarettes. These can delay healing. If you need help quitting, ask your provider. Keep all follow-up visits. Your provider will monitor your injury and activity level. How is this prevented? To prevent a cervical sprain from happening again: Use and maintain good posture. Make any needed adjustments to your workstation to help you do this. Exercise regularly as told by your provider or physical therapist. Avoid risky activities that may cause a cervical sprain. Contact a health care provider if: You have  symptoms that get worse or do not get better after 2 weeks of treatment. You have new symptoms. Your pain gets worse or does not get better with medicine. You have sores or irritated skin on your neck from wearing your cervical collar. Get help right away if: You have severe pain. You develop numbness, tingling, or weakness in any part of your body. You cannot move a part of your body (you have paralysis). You have neck pain along with severe dizziness or headache. This information is not intended to replace advice given to you by your health care provider. Make sure you discuss any questions you have with your health care provider. Document Revised: 01/08/2022 Document Reviewed: 01/08/2022 Elsevier Patient Education  2024 [employer]  

## 2023-02-24 NOTE — Telephone Encounter (Signed)
Patient scheduled for an appt this afternoon

## 2023-03-03 ENCOUNTER — Ambulatory Visit: Payer: BC Managed Care – PPO | Admitting: Orthopaedic Surgery

## 2023-03-07 DIAGNOSIS — C44519 Basal cell carcinoma of skin of other part of trunk: Secondary | ICD-10-CM | POA: Diagnosis not present

## 2023-03-07 DIAGNOSIS — L237 Allergic contact dermatitis due to plants, except food: Secondary | ICD-10-CM | POA: Diagnosis not present

## 2023-03-07 DIAGNOSIS — D225 Melanocytic nevi of trunk: Secondary | ICD-10-CM | POA: Diagnosis not present

## 2023-03-07 DIAGNOSIS — L814 Other melanin hyperpigmentation: Secondary | ICD-10-CM | POA: Diagnosis not present

## 2023-03-08 ENCOUNTER — Other Ambulatory Visit: Payer: Self-pay | Admitting: Nurse Practitioner

## 2023-03-08 DIAGNOSIS — Z6827 Body mass index (BMI) 27.0-27.9, adult: Secondary | ICD-10-CM

## 2023-03-14 ENCOUNTER — Encounter: Payer: Self-pay | Admitting: Nurse Practitioner

## 2023-03-14 ENCOUNTER — Ambulatory Visit: Payer: BC Managed Care – PPO | Admitting: Nurse Practitioner

## 2023-03-14 VITALS — BP 120/82 | HR 113 | Temp 97.0°F | Resp 20 | Ht 68.0 in | Wt 181.0 lb

## 2023-03-14 DIAGNOSIS — R3915 Urgency of urination: Secondary | ICD-10-CM

## 2023-03-14 DIAGNOSIS — Z6827 Body mass index (BMI) 27.0-27.9, adult: Secondary | ICD-10-CM | POA: Diagnosis not present

## 2023-03-14 DIAGNOSIS — F17200 Nicotine dependence, unspecified, uncomplicated: Secondary | ICD-10-CM | POA: Diagnosis not present

## 2023-03-14 DIAGNOSIS — R600 Localized edema: Secondary | ICD-10-CM

## 2023-03-14 MED ORDER — PHENTERMINE HCL 37.5 MG PO CAPS
37.5000 mg | ORAL_CAPSULE | ORAL | 2 refills | Status: DC
Start: 2023-03-14 — End: 2023-06-23

## 2023-03-14 MED ORDER — SOLIFENACIN SUCCINATE 5 MG PO TABS: ORAL_TABLET | ORAL | 1 refills | Status: DC

## 2023-03-14 MED ORDER — FUROSEMIDE 20 MG PO TABS
20.0000 mg | ORAL_TABLET | Freq: Every day | ORAL | 1 refills | Status: DC
Start: 2023-03-14 — End: 2023-09-08

## 2023-03-14 NOTE — Progress Notes (Signed)
Subjective:    Patient ID: Haley Allen, female    DOB: 30-Mar-1981, 42 y.o.   MRN: [id number]   Chief Complaint: medical management of chronic issues      HPI:  Haley Allen is a 42 y.o. who identifies as a female who was assigned female at birth.   Social history: Lives with: husband and son Work history: [employer]   Comes in today for follow up of the following chronic medical issues:  1. Peripheral edema Has several times a week. Is on lasix on prn basis  2. Urinary urgency Is on vesicare daily and is doing well.  3. Smoker Smokes 1/2 - 1 pack a day. Has never had low dose CT scan.  4. BMI 27.0-27.9,adult No recent weight changes. Has been on adipex off an on for several years. Weight goes up and down. Wt Readings from Last 3 Encounters:  03/14/23 181 lb (82.1 kg)  02/24/23 180 lb (81.6 kg)  11/26/22 177 lb (80.3 kg)   BMI Readings from Last 3 Encounters:  03/14/23 27.52 kg/m  02/24/23 27.37 kg/m  11/26/22 26.91 kg/m     New complaints: None today  Allergies  Allergen Reactions   Latex Other (See Comments)    Skin redness   Outpatient Encounter Medications as of 03/14/2023  Medication Sig   cetirizine (ZYRTEC) 10 MG tablet Take 1 tablet (10 mg total) by mouth daily.   cyclobenzaprine (FLEXERIL) 10 MG tablet Take 1 tablet (10 mg total) by mouth 3 (three) times daily as needed for muscle spasms.   fluticasone (FLONASE) 50 MCG/ACT nasal spray Place 2 sprays into both nostrils daily as needed for allergies or rhinitis.   furosemide (LASIX) 20 MG tablet Take 1 tablet (20 mg total) by mouth daily.   Multiple Vitamin (MULTIVITAMIN PO) Take by mouth.   phentermine 37.5 MG capsule Take 1 capsule (37.5 mg total) by mouth every morning.   predniSONE (DELTASONE) 20 MG tablet 2 po at sametime daily for 5 days-   solifenacin (VESICARE) 5 MG tablet TAKE 1 TABLET BY MOUTH EVERYDAY AT BEDTIME   No facility-administered encounter  medications on file as of 03/14/2023.    Past Surgical History:  Procedure Laterality Date   ANTERIOR CERVICAL DECOMP/DISCECTOMY FUSION N/A 06/12/2021   Procedure: C5-6, C6-7 ANTERIOR CERVICAL DISCECTOMY FUSION, ALLOGRAFT, PLATE;  Surgeon: Eldred Manges, MD;  Location: [facility] OR;  Service: Orthopedics;  Laterality: N/A;   WISDOM TOOTH EXTRACTION      Family History  Problem Relation Age of Onset   Hypertension Mother    Diabetes Mother    Depression Mother    Cancer Father 36       renal   Renal cancer Father    Diabetes Maternal Grandmother    Leukemia Paternal Grandfather    Prostate cancer Neg Hx    Colon cancer Neg Hx       Controlled substance contract: n/a      Review of Systems  Constitutional:  Negative for diaphoresis.  Eyes:  Negative for pain.  Respiratory:  Negative for shortness of breath.   Cardiovascular:  Negative for chest pain, palpitations and leg swelling.  Gastrointestinal:  Negative for abdominal pain.  Endocrine: Negative for polydipsia.  Skin:  Negative for rash.  Neurological:  Negative for dizziness, weakness and headaches.  Hematological:  Does not bruise/bleed easily.  All other systems reviewed and are negative.      Objective:   Physical Exam Vitals and nursing note  reviewed.  Constitutional:      General: She is not in acute distress.    Appearance: Normal appearance. She is well-developed.  HENT:     Head: Normocephalic.     Right Ear: Tympanic membrane normal.     Left Ear: Tympanic membrane normal.     Nose: Nose normal.     Mouth/Throat:     Mouth: Mucous membranes are moist.  Eyes:     Pupils: Pupils are equal, round, and reactive to light.  Neck:     Vascular: No carotid bruit or JVD.  Cardiovascular:     Rate and Rhythm: Normal rate and regular rhythm.     Heart sounds: Normal heart sounds.  Pulmonary:     Effort: Pulmonary effort is normal. No respiratory distress.     Breath sounds: Normal breath sounds. No  wheezing or rales.  Chest:     Chest wall: No tenderness.  Abdominal:     General: Bowel sounds are normal. There is no distension or abdominal bruit.     Palpations: Abdomen is soft. There is no hepatomegaly, splenomegaly, mass or pulsatile mass.     Tenderness: There is no abdominal tenderness.  Musculoskeletal:        General: Normal range of motion.     Cervical back: Normal range of motion and neck supple.  Lymphadenopathy:     Cervical: No cervical adenopathy.  Skin:    General: Skin is warm and dry.  Neurological:     Mental Status: She is alert and oriented to person, place, and time.     Deep Tendon Reflexes: Reflexes are normal and symmetric.  Psychiatric:        Behavior: Behavior normal.        Thought Content: Thought content normal.        Judgment: Judgment normal.    BP 120/82   Pulse (!) 113   Temp (!) 97 F (36.1 C) (Temporal)   Resp 20   Ht 5\' 8"  (1.727 m)   Wt 181 lb (82.1 kg)   SpO2 100%   BMI 27.52 kg/m         Assessment & Plan:   [name] comes in today with chief complaint of Medication Refill   Diagnosis and orders addressed:  1. Peripheral edema Elevate legs when sitting - furosemide (LASIX) 20 MG tablet; Take 1 tablet (20 mg total) by mouth daily.  Dispense: 90 tablet; Refill: 1  2. Urinary urgency - solifenacin (VESICARE) 5 MG tablet; TAKE 1 TABLET BY MOUTH EVERYDAY AT BEDTIME  Dispense: 90 tablet; Refill: 1  3. Smoker Smoking cessation encouraged  4. BMI 27.0-27.9,adult Discussed diet and exercise for person with BMI >25 Will recheck weight in 3-6 months  - ToxASSURE Select 13 (MW), Urine - phentermine 37.5 MG capsule; Take 1 capsule (37.5 mg total) by mouth every morning.  Dispense: 30 capsule; Refill: 2   Labs pending Health Maintenance reviewed Diet and exercise encouraged  Follow up plan: 6 months   [name], FNP

## 2023-06-23 ENCOUNTER — Ambulatory Visit: Payer: BC Managed Care – PPO | Admitting: Nurse Practitioner

## 2023-06-23 ENCOUNTER — Encounter: Payer: Self-pay | Admitting: Nurse Practitioner

## 2023-06-23 DIAGNOSIS — Z6827 Body mass index (BMI) 27.0-27.9, adult: Secondary | ICD-10-CM | POA: Diagnosis not present

## 2023-06-23 MED ORDER — PHENTERMINE HCL 37.5 MG PO CAPS
37.5000 mg | ORAL_CAPSULE | ORAL | 2 refills | Status: DC
Start: 2023-06-23 — End: 2023-09-08

## 2023-06-23 NOTE — Progress Notes (Signed)
 Subjective:    Patient ID: Haley Allen, female    DOB: [birth year], 43 y.o.   MRN: [id number]   Chief Complaint: Medication Refill (phentermine )   Medication Refill Pertinent negatives include no abdominal pain, chest pain, diaphoresis, headaches, rash or weakness.    Patient in today for refill of her phemtermine. She has been on and off of this meds for several months. Weight is currently up 5 lbs.  Wt Readings from Last 3 Encounters:  06/23/23 186 lb (84.4 kg)  03/14/23 181 lb (82.1 kg)  02/24/23 180 lb (81.6 kg)    Patient Active Problem List   Diagnosis Date Noted   Peripheral edema 02/25/2022   Seasonal allergic rhinitis due to pollen 02/05/2022   Urinary urgency 02/05/2022   BMI 27.0-27.9,adult 02/05/2022   Gamekeeper's thumb, right 10/15/2021   Cervical spinal stenosis 06/12/2021   Smoker 06/30/2017       Review of Systems  Constitutional:  Negative for diaphoresis.  Eyes:  Negative for pain.  Respiratory:  Negative for shortness of breath.   Cardiovascular:  Negative for chest pain, palpitations and leg swelling.  Gastrointestinal:  Negative for abdominal pain.  Endocrine: Negative for polydipsia.  Skin:  Negative for rash.  Neurological:  Negative for dizziness, weakness and headaches.  Hematological:  Does not bruise/bleed easily.  All other systems reviewed and are negative.      Objective:   Physical Exam Vitals and nursing note reviewed.  Constitutional:      General: She is not in acute distress.    Appearance: Normal appearance. She is well-developed.  Neck:     Vascular: No carotid bruit or JVD.  Cardiovascular:     Rate and Rhythm: Normal rate and regular rhythm.     Heart sounds: Normal heart sounds.  Pulmonary:     Effort: Pulmonary effort is normal. No respiratory distress.     Breath sounds: Normal breath sounds. No wheezing or rales.  Chest:     Chest wall: No tenderness.  Abdominal:     General: Bowel sounds are normal.  There is no distension or abdominal bruit.     Palpations: Abdomen is soft. There is no hepatomegaly, splenomegaly, mass or pulsatile mass.     Tenderness: There is no abdominal tenderness.  Musculoskeletal:        General: Normal range of motion.     Cervical back: Normal range of motion and neck supple.  Lymphadenopathy:     Cervical: No cervical adenopathy.  Skin:    General: Skin is warm and dry.  Neurological:     Mental Status: She is alert and oriented to person, place, and time.     Deep Tendon Reflexes: Reflexes are normal and symmetric.  Psychiatric:        Behavior: Behavior normal.        Thought Content: Thought content normal.        Judgment: Judgment normal.    BP 114/79   Pulse 76   Temp 98.3 F (36.8 C) (Temporal)   Ht 5' 8 (1.727 m)   Wt 186 lb (84.4 kg)   SpO2 98%   BMI 28.28 kg/m         Assessment & Plan:   Haley Allen in today with chief complaint of Medication Refill (phentermine )   1. BMI 27.0-27.9,adult Low fat diet and exercise encouraged Needs to take a break form meds after these refills. - phentermine  37.5 MG capsule; Take 1 capsule (37.5 mg total) by mouth  every morning.  Dispense: 30 capsule; Refill: 2    The above assessment and management plan was discussed with the patient. The patient verbalized understanding of and has agreed to the management plan. Patient is aware to call the clinic if symptoms persist or worsen. Patient is aware when to return to the clinic for a follow-up visit. Patient educated on when it is appropriate to go to the emergency department.   [name], FNP

## 2023-06-23 NOTE — Patient Instructions (Signed)

## 2023-07-16 ENCOUNTER — Telehealth: Payer: BC Managed Care – PPO | Admitting: Nurse Practitioner

## 2023-07-16 DIAGNOSIS — T3695XA Adverse effect of unspecified systemic antibiotic, initial encounter: Secondary | ICD-10-CM | POA: Diagnosis not present

## 2023-07-16 DIAGNOSIS — B379 Candidiasis, unspecified: Secondary | ICD-10-CM

## 2023-07-16 DIAGNOSIS — R399 Unspecified symptoms and signs involving the genitourinary system: Secondary | ICD-10-CM | POA: Diagnosis not present

## 2023-07-16 MED ORDER — NITROFURANTOIN MONOHYD MACRO 100 MG PO CAPS
100.0000 mg | ORAL_CAPSULE | Freq: Two times a day (BID) | ORAL | 0 refills | Status: AC
Start: 2023-07-16 — End: 2023-07-21

## 2023-07-16 MED ORDER — FLUCONAZOLE 150 MG PO TABS
150.0000 mg | ORAL_TABLET | Freq: Once | ORAL | 0 refills | Status: AC
Start: 2023-07-16 — End: 2023-07-16

## 2023-07-16 NOTE — Progress Notes (Signed)
Virtual Visit Consent   Haley Allen, you are scheduled for a virtual visit with a [facility] provider today. Just as with appointments in the office, your consent must be obtained to participate. Your consent will be active for this visit and any virtual visit you may have with one of our providers in the next 365 days. If you have a MyChart account, a copy of this consent can be sent to you electronically.  As this is a virtual visit, video technology does not allow for your provider to perform a traditional examination. This may limit your provider's ability to fully assess your condition. If your provider identifies any concerns that need to be evaluated in person or the need to arrange testing (such as labs, EKG, etc.), we will make arrangements to do so. Although advances in technology are sophisticated, we cannot ensure that it will always work on either your end or our end. If the connection with a video visit is poor, the visit may have to be switched to a telephone visit. With either a video or telephone visit, we are not always able to ensure that we have a secure connection.  By engaging in this virtual visit, you consent to the provision of healthcare and authorize for your insurance to be billed (if applicable) for the services provided during this visit. Depending on your insurance coverage, you may receive a charge related to this service.  I need to obtain your verbal consent now. Are you willing to proceed with your visit today? Haley Allen has provided verbal consent on 07/16/2023 for a virtual visit (video or telephone). Claiborne Rigg, NP  Date: 07/16/2023 3:38 PM  Virtual Visit via Video Note   I, Claiborne Rigg, connected with  Haley Allen  ([id number], Dec 13, 1980) on 07/16/23 at  4:15 PM EST by a video-enabled telemedicine application and verified that I am speaking with the correct person using two identifiers.  Location: Patient: Virtual Visit Location Patient:  Home Provider: Virtual Visit Location Provider: Home Office   I discussed the limitations of evaluation and management by telemedicine and the availability of in person appointments. The patient expressed understanding and agreed to proceed.    History of Present Illness: Haley Allen is a 43 y.o. who identifies as a female who was assigned female at birth, and is being seen today for UTI symptoms.  Ms  Pfahler has been experiencing the following symptoms for the past 24 hours: dysuria, incomplete bladder emptying, urgency, cloudy urine. Denies back pain, hematuria or fever.   Problems:  Patient Active Problem List   Diagnosis Date Noted   Peripheral edema 02/25/2022   Seasonal allergic rhinitis due to pollen 02/05/2022   Urinary urgency 02/05/2022   BMI 27.0-27.9,adult 02/05/2022   Gamekeeper's thumb, right 10/15/2021   Cervical spinal stenosis 06/12/2021   Smoker 06/30/2017    Allergies:  Allergies  Allergen Reactions   Latex Other (See Comments)    Skin redness   Medications:  Current Outpatient Medications:    fluconazole (DIFLUCAN) 150 MG tablet, Take 1 tablet (150 mg total) by mouth once for 1 dose., Disp: 1 tablet, Rfl: 0   nitrofurantoin, macrocrystal-monohydrate, (MACROBID) 100 MG capsule, Take 1 capsule (100 mg total) by mouth 2 (two) times daily for 5 days., Disp: 10 capsule, Rfl: 0   cetirizine (ZYRTEC) 10 MG tablet, Take 1 tablet (10 mg total) by mouth daily., Disp: 90 tablet, Rfl: 1   cyclobenzaprine (FLEXERIL) 10 MG tablet, Take 1 tablet (10 mg  total) by mouth 3 (three) times daily as needed for muscle spasms., Disp: 30 tablet, Rfl: 1   fluticasone (FLONASE) 50 MCG/ACT nasal spray, Place 2 sprays into both nostrils daily as needed for allergies or rhinitis., Disp: 16 g, Rfl: 5   furosemide (LASIX) 20 MG tablet, Take 1 tablet (20 mg total) by mouth daily., Disp: 90 tablet, Rfl: 1   Multiple Vitamin (MULTIVITAMIN PO), Take by mouth., Disp: , Rfl:    phentermine 37.5  MG capsule, Take 1 capsule (37.5 mg total) by mouth every morning., Disp: 30 capsule, Rfl: 2   solifenacin (VESICARE) 5 MG tablet, TAKE 1 TABLET BY MOUTH EVERYDAY AT BEDTIME, Disp: 90 tablet, Rfl: 1  Observations/Objective: Patient is well-developed, well-nourished in no acute distress.  Resting comfortably  at home.  Head is normocephalic, atraumatic.  No labored breathing.  Speech is clear and coherent with logical content.  Patient is alert and oriented at baseline.    Assessment and Plan: 1. UTI symptoms (Primary) - nitrofurantoin, macrocrystal-monohydrate, (MACROBID) 100 MG capsule; Take 1 capsule (100 mg total) by mouth 2 (two) times daily for 5 days.  Dispense: 10 capsule; Refill: 0  2. Antibiotic-induced yeast infection - fluconazole (DIFLUCAN) 150 MG tablet; Take 1 tablet (150 mg total) by mouth once for 1 dose.  Dispense: 1 tablet; Refill: 0   Follow Up Instructions: I discussed the assessment and treatment plan with the patient. The patient was provided an opportunity to ask questions and all were answered. The patient agreed with the plan and demonstrated an understanding of the instructions.  A copy of instructions were sent to the patient via MyChart unless otherwise noted below.    The patient was advised to call back or seek an in-person evaluation if the symptoms worsen or if the condition fails to improve as anticipated.    Claiborne Rigg, NP

## 2023-07-16 NOTE — Patient Instructions (Signed)
Arnette Felts, thank you for joining Claiborne Rigg, NP for today's virtual visit.  While this provider is not your primary care provider (PCP), if your PCP is located in our provider database this encounter information will be shared with them immediately following your visit.   A [facility] MyChart account gives you access to today's visit and all your visits, tests, and labs performed at [facility] " click here if you don't have a [facility] MyChart account or go to [email or web address]  Consent: (Patient) Haley Allen provided verbal consent for this virtual visit at the beginning of the encounter.  Current Medications:  Current Outpatient Medications:    fluconazole (DIFLUCAN) 150 MG tablet, Take 1 tablet (150 mg total) by mouth once for 1 dose., Disp: 1 tablet, Rfl: 0   nitrofurantoin, macrocrystal-monohydrate, (MACROBID) 100 MG capsule, Take 1 capsule (100 mg total) by mouth 2 (two) times daily for 5 days., Disp: 10 capsule, Rfl: 0   cetirizine (ZYRTEC) 10 MG tablet, Take 1 tablet (10 mg total) by mouth daily., Disp: 90 tablet, Rfl: 1   cyclobenzaprine (FLEXERIL) 10 MG tablet, Take 1 tablet (10 mg total) by mouth 3 (three) times daily as needed for muscle spasms., Disp: 30 tablet, Rfl: 1   fluticasone (FLONASE) 50 MCG/ACT nasal spray, Place 2 sprays into both nostrils daily as needed for allergies or rhinitis., Disp: 16 g, Rfl: 5   furosemide (LASIX) 20 MG tablet, Take 1 tablet (20 mg total) by mouth daily., Disp: 90 tablet, Rfl: 1   Multiple Vitamin (MULTIVITAMIN PO), Take by mouth., Disp: , Rfl:    phentermine 37.5 MG capsule, Take 1 capsule (37.5 mg total) by mouth every morning., Disp: 30 capsule, Rfl: 2   solifenacin (VESICARE) 5 MG tablet, TAKE 1 TABLET BY MOUTH EVERYDAY AT BEDTIME, Disp: 90 tablet, Rfl: 1   Medications ordered in this encounter:  Meds ordered this encounter  Medications   nitrofurantoin, macrocrystal-monohydrate, (MACROBID) 100 MG  capsule    Sig: Take 1 capsule (100 mg total) by mouth 2 (two) times daily for 5 days.    Dispense:  10 capsule    Refill:  0    Supervising Provider:   Merrilee Jansky [[id number]]   fluconazole (DIFLUCAN) 150 MG tablet    Sig: Take 1 tablet (150 mg total) by mouth once for 1 dose.    Dispense:  1 tablet    Refill:  0    Supervising Provider:   Merrilee Jansky [[id number]]     *If you need refills on other medications prior to your next appointment, please contact your pharmacy*  Follow-Up: Call back or seek an in-person evaluation if the symptoms worsen or if the condition fails to improve as anticipated.  [facility] [phone]    If you have been instructed to have an in-person evaluation today at a local Urgent Care facility, please use the link below. It will take you to a list of all of our available [facility], including address, phone number and hours of operation. Please do not delay care.  [facility]  If you or a family member do not have a primary care provider, use the link below to schedule a visit and establish care. When you choose a [facility] primary care physician or advanced practice provider, you gain a long-term partner in health. Find a Primary Care Provider  Learn more about [facility] in-office and virtual care options: [facility] -  Get Care Now

## 2023-08-20 ENCOUNTER — Telehealth: Admitting: Family Medicine

## 2023-08-20 DIAGNOSIS — B9689 Other specified bacterial agents as the cause of diseases classified elsewhere: Secondary | ICD-10-CM | POA: Diagnosis not present

## 2023-08-20 DIAGNOSIS — J019 Acute sinusitis, unspecified: Secondary | ICD-10-CM | POA: Diagnosis not present

## 2023-08-20 MED ORDER — AMOXICILLIN-POT CLAVULANATE 875-125 MG PO TABS: 1.0000 | ORAL_TABLET | Freq: Two times a day (BID) | ORAL | 0 refills | Status: DC

## 2023-08-20 NOTE — Patient Instructions (Signed)

## 2023-08-20 NOTE — Progress Notes (Signed)
 Virtual Visit Consent   Haley Allen, you are scheduled for a virtual visit with a [facility] provider today. Just as with appointments in the office, your consent must be obtained to participate. Your consent will be active for this visit and any virtual visit you may have with one of our providers in the next 365 days. If you have a MyChart account, a copy of this consent can be sent to you electronically.  As this is a virtual visit, video technology does not allow for your provider to perform a traditional examination. This may limit your provider's ability to fully assess your condition. If your provider identifies any concerns that need to be evaluated in person or the need to arrange testing (such as labs, EKG, etc.), we will make arrangements to do so. Although advances in technology are sophisticated, we cannot ensure that it will always work on either your end or our end. If the connection with a video visit is poor, the visit may have to be switched to a telephone visit. With either a video or telephone visit, we are not always able to ensure that we have a secure connection.  By engaging in this virtual visit, you consent to the provision of healthcare and authorize for your insurance to be billed (if applicable) for the services provided during this visit. Depending on your insurance coverage, you may receive a charge related to this service.  I need to obtain your verbal consent now. Are you willing to proceed with your visit today? Cortny Bambach has provided verbal consent on 08/20/2023 for a virtual visit (video or telephone). Georgana Curio, FNP  Date: 08/20/2023 12:10 PM   Virtual Visit via Video Note   I, Georgana Curio, connected with  Odeal Welden  ([id number], 1980-11-02) on 08/20/23 at 12:15 PM EST by a video-enabled telemedicine application and verified that I am speaking with the correct person using two identifiers.  Location: Patient: Virtual Visit Location Patient:  Home Provider: Virtual Visit Location Provider: Home Office   I discussed the limitations of evaluation and management by telemedicine and the availability of in person appointments. The patient expressed understanding and agreed to proceed.    History of Present Illness: Haley Allen is a 43 y.o. who identifies as a female who was assigned female at birth, and is being seen today for sinus pressure and pain with post nasal drainage, cough, no wheezing, SOB or fever. Mucus is thick green and brown. Marland Kitchen  HPI: HPI  Problems:  Patient Active Problem List   Diagnosis Date Noted   Peripheral edema 02/25/2022   Seasonal allergic rhinitis due to pollen 02/05/2022   Urinary urgency 02/05/2022   BMI 27.0-27.9,adult 02/05/2022   Gamekeeper's thumb, right 10/15/2021   Cervical spinal stenosis 06/12/2021   Smoker 06/30/2017    Allergies:  Allergies  Allergen Reactions   Latex Other (See Comments)    Skin redness   Medications:  Current Outpatient Medications:    amoxicillin-clavulanate (AUGMENTIN) 875-125 MG tablet, Take 1 tablet by mouth 2 (two) times daily., Disp: 20 tablet, Rfl: 0   cetirizine (ZYRTEC) 10 MG tablet, Take 1 tablet (10 mg total) by mouth daily., Disp: 90 tablet, Rfl: 1   cyclobenzaprine (FLEXERIL) 10 MG tablet, Take 1 tablet (10 mg total) by mouth 3 (three) times daily as needed for muscle spasms., Disp: 30 tablet, Rfl: 1   fluticasone (FLONASE) 50 MCG/ACT nasal spray, Place 2 sprays into both nostrils daily as needed for allergies or rhinitis., Disp: 16  g, Rfl: 5   furosemide (LASIX) 20 MG tablet, Take 1 tablet (20 mg total) by mouth daily., Disp: 90 tablet, Rfl: 1   Multiple Vitamin (MULTIVITAMIN PO), Take by mouth., Disp: , Rfl:    phentermine 37.5 MG capsule, Take 1 capsule (37.5 mg total) by mouth every morning., Disp: 30 capsule, Rfl: 2   solifenacin (VESICARE) 5 MG tablet, TAKE 1 TABLET BY MOUTH EVERYDAY AT BEDTIME, Disp: 90 tablet, Rfl:  1  Observations/Objective: Patient is well-developed, well-nourished in no acute distress.  Resting comfortably  at home.  Head is normocephalic, atraumatic.  No labored breathing.  Speech is clear and coherent with logical content.  Patient is alert and oriented at baseline.    Assessment and Plan: 1. Acute bacterial sinusitis (Primary)  Increase fluids, humidifier at night, tylenol or ibuprofen as directed, UC if sx persist or worsen.   Follow Up Instructions: I discussed the assessment and treatment plan with the patient. The patient was provided an opportunity to ask questions and all were answered. The patient agreed with the plan and demonstrated an understanding of the instructions.  A copy of instructions were sent to the patient via MyChart unless otherwise noted below.     The patient was advised to call back or seek an in-person evaluation if the symptoms worsen or if the condition fails to improve as anticipated.    Georgana Curio, FNP

## 2023-09-08 ENCOUNTER — Ambulatory Visit (INDEPENDENT_AMBULATORY_CARE_PROVIDER_SITE_OTHER)

## 2023-09-08 ENCOUNTER — Ambulatory Visit: Payer: BC Managed Care – PPO | Admitting: Nurse Practitioner

## 2023-09-08 ENCOUNTER — Encounter: Payer: Self-pay | Admitting: Nurse Practitioner

## 2023-09-08 VITALS — BP 106/74 | HR 93 | Temp 97.8°F | Ht 68.0 in | Wt 181.4 lb

## 2023-09-08 DIAGNOSIS — R3915 Urgency of urination: Secondary | ICD-10-CM

## 2023-09-08 DIAGNOSIS — Z0000 Encounter for general adult medical examination without abnormal findings: Secondary | ICD-10-CM

## 2023-09-08 DIAGNOSIS — Z0001 Encounter for general adult medical examination with abnormal findings: Secondary | ICD-10-CM | POA: Diagnosis not present

## 2023-09-08 DIAGNOSIS — Z01419 Encounter for gynecological examination (general) (routine) without abnormal findings: Secondary | ICD-10-CM

## 2023-09-08 DIAGNOSIS — F17200 Nicotine dependence, unspecified, uncomplicated: Secondary | ICD-10-CM | POA: Diagnosis not present

## 2023-09-08 DIAGNOSIS — R600 Localized edema: Secondary | ICD-10-CM

## 2023-09-08 DIAGNOSIS — Z6827 Body mass index (BMI) 27.0-27.9, adult: Secondary | ICD-10-CM

## 2023-09-08 MED ORDER — FUROSEMIDE 20 MG PO TABS: 20.0000 mg | ORAL_TABLET | Freq: Every day | ORAL | 1 refills | Status: DC

## 2023-09-08 MED ORDER — PHENTERMINE HCL 37.5 MG PO CAPS: 37.5000 mg | ORAL_CAPSULE | ORAL | 2 refills | Status: DC

## 2023-09-08 MED ORDER — SOLIFENACIN SUCCINATE 5 MG PO TABS: ORAL_TABLET | ORAL | 1 refills | Status: DC

## 2023-09-08 NOTE — Patient Instructions (Signed)
 Exercising to Stay Healthy To become healthy and stay healthy, it is recommended that you do moderate-intensity and vigorous-intensity exercise. You can tell that you are exercising at a moderate intensity if your heart starts beating faster and you start breathing faster but can still hold a conversation. You can tell that you are exercising at a vigorous intensity if you are breathing much harder and faster and cannot hold a conversation while exercising. How can exercise benefit me? Exercising regularly is important. It has many health benefits, such as: Improving overall fitness, flexibility, and endurance. Increasing bone density. Helping with weight control. Decreasing body fat. Increasing muscle strength and endurance. Reducing stress and tension, anxiety, depression, or anger. Improving overall health. What guidelines should I follow while exercising? Before you start a new exercise program, talk with your health care provider. Do not exercise so much that you hurt yourself, feel dizzy, or get very short of breath. Wear comfortable clothes and wear shoes with good support. Drink plenty of water while you exercise to prevent dehydration or heat stroke. Work out until your breathing and your heartbeat get faster (moderate intensity). How often should I exercise? Choose an activity that you enjoy, and set realistic goals. Your health care provider can help you make an activity plan that is individually designed and works best for you. Exercise regularly as told by your health care provider. This may include: Doing strength training two times a week, such as: Lifting weights. Using resistance bands. Push-ups. Sit-ups. Yoga. Doing a certain intensity of exercise for a given amount of time. Choose from these options: A total of 150 minutes of moderate-intensity exercise every week. A total of 75 minutes of vigorous-intensity exercise every week. A mix of moderate-intensity and  vigorous-intensity exercise every week. Children, pregnant women, people who have not exercised regularly, people who are overweight, and older adults may need to talk with a health care provider about what activities are safe to perform. If you have a medical condition, be sure to talk with your health care provider before you start a new exercise program. What are some exercise ideas? Moderate-intensity exercise ideas include: Walking 1 mile (1.6 km) in about 15 minutes. Biking. Hiking. Golfing. Dancing. Water aerobics. Vigorous-intensity exercise ideas include: Walking 4.5 miles (7.2 km) or more in about 1 hour. Jogging or running 5 miles (8 km) in about 1 hour. Biking 10 miles (16.1 km) or more in about 1 hour. Lap swimming. Roller-skating or in-line skating. Cross-country skiing. Vigorous competitive sports, such as football, basketball, and soccer. Jumping rope. Aerobic dancing. What are some everyday activities that can help me get exercise? Yard work, such as: Child psychotherapist. Raking and bagging leaves. Washing your car. Pushing a stroller. Shoveling snow. Gardening. Washing windows or floors. How can I be more active in my day-to-day activities? Use stairs instead of an elevator. Take a walk during your lunch break. If you drive, park your car farther away from your work or school. If you take public transportation, get off one stop early and walk the rest of the way. Stand up or walk around during all of your indoor phone calls. Get up, stretch, and walk around every 30 minutes throughout the day. Enjoy exercise with a friend. Support to continue exercising will help you keep a regular routine of activity. Where to find more information You can find more information about exercising to stay healthy from: U.S. Department of Health and Human Services: ThisPath.fi Centers for Disease Control and Prevention (  CDC): FootballExhibition.com.br Summary Exercising regularly is  important. It will improve your overall fitness, flexibility, and endurance. Regular exercise will also improve your overall health. It can help you control your weight, reduce stress, and improve your bone density. Do not exercise so much that you hurt yourself, feel dizzy, or get very short of breath. Before you start a new exercise program, talk with your health care provider. This information is not intended to replace advice given to you by your health care provider. Make sure you discuss any questions you have with your health care provider. Document Revised: 10/03/2020 Document Reviewed: 10/03/2020 Elsevier Patient Education  2024 [employer].

## 2023-09-08 NOTE — Addendum Note (Signed)
 Addended by: Bennie Pierini on: 09/08/2023 04:37 PM   Modules accepted: Level of Service

## 2023-09-08 NOTE — Progress Notes (Signed)
 Subjective:    Patient ID: Haley Allen, female    DOB: 10/10/1980, 43 y.o.   MRN: [id number]   Chief Complaint: annual physical     HPI:  Haley Allen is a 43 y.o. who identifies as a female who was assigned female at birth.   Social history: Lives with: husband and son Work history: [employer]   Comes in today for follow up of the following chronic medical issues:  1. Annual physical No pap sees DR. Buist. Had pap November 20,2025  2. Peripheral edema She is on lasix on an as needed basis. Has frequent edema.  3. Urinary urgency Is on vesicare daiy and is doing well.  4. Smoker Stop smoking 1 month ago  5. BMI 27.0-27.9,adult Has been on phentermine and has lost some weight. Weight is down 5lbs  Wt Readings from Last 3 Encounters:  09/08/23 181 lb 6.4 oz (82.3 kg)  06/23/23 186 lb (84.4 kg)  03/14/23 181 lb (82.1 kg)   BMI Readings from Last 3 Encounters:  09/08/23 27.58 kg/m  06/23/23 28.28 kg/m  03/14/23 27.52 kg/m       New complaints: None today  Allergies  Allergen Reactions   Latex Other (See Comments)    Skin redness   Outpatient Encounter Medications as of 09/08/2023  Medication Sig   amoxicillin-clavulanate (AUGMENTIN) 875-125 MG tablet Take 1 tablet by mouth 2 (two) times daily.   cetirizine (ZYRTEC) 10 MG tablet Take 1 tablet (10 mg total) by mouth daily.   cyclobenzaprine (FLEXERIL) 10 MG tablet Take 1 tablet (10 mg total) by mouth 3 (three) times daily as needed for muscle spasms.   fluticasone (FLONASE) 50 MCG/ACT nasal spray Place 2 sprays into both nostrils daily as needed for allergies or rhinitis.   furosemide (LASIX) 20 MG tablet Take 1 tablet (20 mg total) by mouth daily.   Multiple Vitamin (MULTIVITAMIN PO) Take by mouth.   phentermine 37.5 MG capsule Take 1 capsule (37.5 mg total) by mouth every morning.   solifenacin (VESICARE) 5 MG tablet TAKE 1 TABLET BY MOUTH EVERYDAY AT BEDTIME   No  facility-administered encounter medications on file as of 09/08/2023.    Past Surgical History:  Procedure Laterality Date   ANTERIOR CERVICAL DECOMP/DISCECTOMY FUSION N/A 06/12/2021   Procedure: C5-6, C6-7 ANTERIOR CERVICAL DISCECTOMY FUSION, ALLOGRAFT, PLATE;  Surgeon: Eldred Manges, MD;  Location: [facility] OR;  Service: Orthopedics;  Laterality: N/A;   WISDOM TOOTH EXTRACTION      Family History  Problem Relation Age of Onset   Hypertension Mother    Diabetes Mother    Depression Mother    Cancer Father 80       renal   Renal cancer Father    Diabetes Maternal Grandmother    Leukemia Paternal Grandfather    Prostate cancer Neg Hx    Colon cancer Neg Hx       Controlled substance contract: n/a     Review of Systems  Constitutional:  Negative for diaphoresis.  Eyes:  Negative for pain.  Respiratory:  Negative for shortness of breath.   Cardiovascular:  Negative for chest pain, palpitations and leg swelling.  Gastrointestinal:  Negative for abdominal pain.  Endocrine: Negative for polydipsia.  Skin:  Negative for rash.  Neurological:  Negative for dizziness, weakness and headaches.  Hematological:  Does not bruise/bleed easily.  All other systems reviewed and are negative.      Objective:   Physical Exam Vitals and nursing note  reviewed.  Constitutional:      General: She is not in acute distress.    Appearance: Normal appearance. She is well-developed.  HENT:     Head: Normocephalic.     Right Ear: Tympanic membrane normal.     Left Ear: Tympanic membrane normal.     Nose: Nose normal.     Mouth/Throat:     Mouth: Mucous membranes are moist.  Eyes:     Pupils: Pupils are equal, round, and reactive to light.  Neck:     Vascular: No carotid bruit or JVD.  Cardiovascular:     Rate and Rhythm: Normal rate and regular rhythm.     Heart sounds: Normal heart sounds.  Pulmonary:     Effort: Pulmonary effort is normal. No respiratory distress.     Breath sounds:  Normal breath sounds. No wheezing or rales.  Chest:     Chest wall: No tenderness.  Abdominal:     General: Bowel sounds are normal. There is no distension or abdominal bruit.     Palpations: Abdomen is soft. There is no hepatomegaly, splenomegaly, mass or pulsatile mass.     Tenderness: There is no abdominal tenderness.  Musculoskeletal:        General: Normal range of motion.     Cervical back: Normal range of motion and neck supple.  Lymphadenopathy:     Cervical: No cervical adenopathy.  Skin:    General: Skin is warm and dry.  Neurological:     Mental Status: She is alert and oriented to person, place, and time.     Deep Tendon Reflexes: Reflexes are normal and symmetric.  Psychiatric:        Behavior: Behavior normal.        Thought Content: Thought content normal.        Judgment: Judgment normal.     BP 106/74   Pulse 93   Temp 97.8 F (36.6 C)   Ht 5\' 8"  (1.727 m)   Wt 181 lb 6.4 oz (82.3 kg)   SpO2 98%   BMI 27.58 kg/m   EKG NSR-Haley Daphine Deutscher, FNP  Chest xray- pending radiology report-Haley Daphine Deutscher, FNP      Assessment & Plan:   Haley Allen comes in today with chief complaint of annual physical  Diagnosis and orders addressed:  1. Peripheral edema - furosemide (LASIX) 20 MG tablet; Take 1 tablet (20 mg total) by mouth daily. (NEEDS TO BE SEEN BEFORE NEXT REFILL)  Dispense: 90 tablet; Refill: 1  2. Urinary urgency Force fluids - solifenacin (VESICARE) 5 MG tablet; TAKE 1 TABLET BY MOUTH EVERYDAY AT BEDTIME  Dispense: 90 tablet; Refill: 1  3. Smoker Smoking cessation encouraged  4. BMI 27.0-27.9,adult Discussed diet and exercise for person with BMI >25 Will recheck weight in 3-6 months  - phentermine 37.5 MG capsule; Take 1 capsule (37.5 mg total) by mouth every morning.  Dispense: 30 capsule; Refill: 2   Labs pending Health Maintenance reviewed Diet and exercise encouraged  Follow up plan: 6 months   Haley  Daphine Deutscher, FNP

## 2023-09-09 LAB — CMP14+EGFR
ALT: 14 IU/L (ref 0–32)
AST: 25 IU/L (ref 0–40)
Albumin: 4.4 g/dL (ref 3.9–4.9)
Alkaline Phosphatase: 68 IU/L (ref 44–121)
BUN/Creatinine Ratio: 12 (ref 9–23)
BUN: 10 mg/dL (ref 6–24)
Bilirubin Total: 0.2 mg/dL (ref 0.0–1.2)
CO2: 25 mmol/L (ref 20–29)
Calcium: 9.4 mg/dL (ref 8.7–10.2)
Chloride: 100 mmol/L (ref 96–106)
Creatinine, Ser: 0.86 mg/dL (ref 0.57–1.00)
Globulin, Total: 2.8 g/dL (ref 1.5–4.5)
Glucose: 66 mg/dL — ABNORMAL LOW (ref 70–99)
Potassium: 4.6 mmol/L (ref 3.5–5.2)
Sodium: 140 mmol/L (ref 134–144)
Total Protein: 7.2 g/dL (ref 6.0–8.5)
eGFR: 85 mL/min/{1.73_m2} (ref >59)

## 2023-09-09 LAB — CBC WITH DIFFERENTIAL/PLATELET
Basophils Absolute: 0.1 10*3/uL (ref 0.0–0.2)
Basos: 2 %
EOS (ABSOLUTE): 0.3 10*3/uL (ref 0.0–0.4)
Eos: 4 %
Hematocrit: 42.8 % (ref 34.0–46.6)
Hemoglobin: 14.1 g/dL (ref 11.1–15.9)
Immature Grans (Abs): 0 10*3/uL (ref 0.0–0.1)
Immature Granulocytes: 1 %
Lymphocytes Absolute: 1.8 10*3/uL (ref 0.7–3.1)
Lymphs: 26 %
MCH: 31.1 pg (ref 26.6–33.0)
MCHC: 32.9 g/dL (ref 31.5–35.7)
MCV: 94 fL (ref 79–97)
Monocytes Absolute: 0.5 10*3/uL (ref 0.1–0.9)
Monocytes: 8 %
Neutrophils Absolute: 4 10*3/uL (ref 1.4–7.0)
Neutrophils: 59 %
Platelets: 391 10*3/uL (ref 150–450)
RBC: 4.54 x10E6/uL (ref 3.77–5.28)
RDW: 12.6 % (ref 11.7–15.4)
WBC: 6.7 10*3/uL (ref 3.4–10.8)

## 2023-09-09 LAB — THYROID PANEL WITH TSH
Free Thyroxine Index: 2.6 (ref 1.2–4.9)
T3 Uptake Ratio: 32 % (ref 24–39)
T4, Total: 8.1 ug/dL (ref 4.5–12.0)
TSH: 2.41 u[IU]/mL (ref 0.450–4.500)

## 2023-09-09 LAB — LIPID PANEL
Chol/HDL Ratio: 3.6 ratio (ref 0.0–4.4)
Cholesterol, Total: 217 mg/dL — ABNORMAL HIGH (ref 100–199)
HDL: 60 mg/dL (ref >39)
LDL Chol Calc (NIH): 147 mg/dL — ABNORMAL HIGH (ref 0–99)
Triglycerides: 59 mg/dL (ref 0–149)
VLDL Cholesterol Cal: 10 mg/dL (ref 5–40)

## 2023-10-03 DIAGNOSIS — L209 Atopic dermatitis, unspecified: Secondary | ICD-10-CM | POA: Diagnosis not present

## 2023-10-04 DIAGNOSIS — Z1231 Encounter for screening mammogram for malignant neoplasm of breast: Secondary | ICD-10-CM | POA: Diagnosis not present

## 2023-10-04 LAB — HM MAMMOGRAPHY

## 2023-10-31 DIAGNOSIS — L209 Atopic dermatitis, unspecified: Secondary | ICD-10-CM | POA: Diagnosis not present

## 2023-10-31 DIAGNOSIS — D485 Neoplasm of uncertain behavior of skin: Secondary | ICD-10-CM | POA: Diagnosis not present

## 2023-10-31 DIAGNOSIS — L308 Other specified dermatitis: Secondary | ICD-10-CM | POA: Diagnosis not present

## 2023-11-15 DIAGNOSIS — L209 Atopic dermatitis, unspecified: Secondary | ICD-10-CM | POA: Diagnosis not present

## 2023-12-05 ENCOUNTER — Encounter: Payer: Self-pay | Admitting: Nurse Practitioner

## 2023-12-05 ENCOUNTER — Ambulatory Visit: Admitting: Nurse Practitioner

## 2023-12-05 DIAGNOSIS — Z6827 Body mass index (BMI) 27.0-27.9, adult: Secondary | ICD-10-CM | POA: Diagnosis not present

## 2023-12-05 MED ORDER — PHENTERMINE HCL 37.5 MG PO CAPS
37.5000 mg | ORAL_CAPSULE | ORAL | 2 refills | Status: DC
Start: 2023-12-05 — End: 2024-04-26

## 2023-12-05 NOTE — Patient Instructions (Signed)
 Exercising to Stay Healthy To become healthy and stay healthy, it is recommended that you do moderate-intensity and vigorous-intensity exercise. You can tell that you are exercising at a moderate intensity if your heart starts beating faster and you start breathing faster but can still hold a conversation. You can tell that you are exercising at a vigorous intensity if you are breathing much harder and faster and cannot hold a conversation while exercising. How can exercise benefit me? Exercising regularly is important. It has many health benefits, such as: Improving overall fitness, flexibility, and endurance. Increasing bone density. Helping with weight control. Decreasing body fat. Increasing muscle strength and endurance. Reducing stress and tension, anxiety, depression, or anger. Improving overall health. What guidelines should I follow while exercising? Before you start a new exercise program, talk with your health care provider. Do not exercise so much that you hurt yourself, feel dizzy, or get very short of breath. Wear comfortable clothes and wear shoes with good support. Drink plenty of water while you exercise to prevent dehydration or heat stroke. Work out until your breathing and your heartbeat get faster (moderate intensity). How often should I exercise? Choose an activity that you enjoy, and set realistic goals. Your health care provider can help you make an activity plan that is individually designed and works best for you. Exercise regularly as told by your health care provider. This may include: Doing strength training two times a week, such as: Lifting weights. Using resistance bands. Push-ups. Sit-ups. Yoga. Doing a certain intensity of exercise for a given amount of time. Choose from these options: A total of 150 minutes of moderate-intensity exercise every week. A total of 75 minutes of vigorous-intensity exercise every week. A mix of moderate-intensity and  vigorous-intensity exercise every week. Children, pregnant women, people who have not exercised regularly, people who are overweight, and older adults may need to talk with a health care provider about what activities are safe to perform. If you have a medical condition, be sure to talk with your health care provider before you start a new exercise program. What are some exercise ideas? Moderate-intensity exercise ideas include: Walking 1 mile (1.6 km) in about 15 minutes. Biking. Hiking. Golfing. Dancing. Water aerobics. Vigorous-intensity exercise ideas include: Walking 4.5 miles (7.2 km) or more in about 1 hour. Jogging or running 5 miles (8 km) in about 1 hour. Biking 10 miles (16.1 km) or more in about 1 hour. Lap swimming. Roller-skating or in-line skating. Cross-country skiing. Vigorous competitive sports, such as football, basketball, and soccer. Jumping rope. Aerobic dancing. What are some everyday activities that can help me get exercise? Yard work, such as: Child psychotherapist. Raking and bagging leaves. Washing your car. Pushing a stroller. Shoveling snow. Gardening. Washing windows or floors. How can I be more active in my day-to-day activities? Use stairs instead of an elevator. Take a walk during your lunch break. If you drive, park your car farther away from your work or school. If you take public transportation, get off one stop early and walk the rest of the way. Stand up or walk around during all of your indoor phone calls. Get up, stretch, and walk around every 30 minutes throughout the day. Enjoy exercise with a friend. Support to continue exercising will help you keep a regular routine of activity. Where to find more information You can find more information about exercising to stay healthy from: U.S. Department of Health and Human Services: ThisPath.fi Centers for Disease Control and Prevention (  CDC): FootballExhibition.com.br Summary Exercising regularly is  important. It will improve your overall fitness, flexibility, and endurance. Regular exercise will also improve your overall health. It can help you control your weight, reduce stress, and improve your bone density. Do not exercise so much that you hurt yourself, feel dizzy, or get very short of breath. Before you start a new exercise program, talk with your health care provider. This information is not intended to replace advice given to you by your health care provider. Make sure you discuss any questions you have with your health care provider. Document Revised: 10/03/2020 Document Reviewed: 10/03/2020 Elsevier Patient Education  2024 [employer].

## 2023-12-05 NOTE — Progress Notes (Signed)
 Subjective:    Patient ID: Haley Allen, female    DOB: 04/20/1981, 43 y.o.   MRN: [id number]   Chief Complaint: weight check  Medication Refill Pertinent negatives include no abdominal pain, chest pain, diaphoresis, headaches, rash or weakness.    Patient in today for refill of Haley Allen phemtermine. Haley Allen has been on and off of this meds for several months. Weight is currently down 14lbs.  Wt Readings from Last 3 Encounters:  12/05/23 167 lb (75.8 kg)  09/08/23 181 lb 6.4 oz (82.3 kg)  06/23/23 186 lb (84.4 kg)   BMI Readings from Last 3 Encounters:  12/05/23 25.39 kg/m  09/08/23 27.58 kg/m  06/23/23 28.28 kg/m     Patient Active Problem List   Diagnosis Date Noted   Peripheral edema 02/25/2022   Seasonal allergic rhinitis due to pollen 02/05/2022   Urinary urgency 02/05/2022   BMI 27.0-27.9,adult 02/05/2022   Gamekeeper's thumb, right 10/15/2021   Cervical spinal stenosis 06/12/2021   Smoker 06/30/2017       Review of Systems  Constitutional:  Negative for diaphoresis.  Eyes:  Negative for pain.  Respiratory:  Negative for shortness of breath.   Cardiovascular:  Negative for chest pain, palpitations and leg swelling.  Gastrointestinal:  Negative for abdominal pain.  Endocrine: Negative for polydipsia.  Skin:  Negative for rash.  Neurological:  Negative for dizziness, weakness and headaches.  Hematological:  Does not bruise/bleed easily.  All other systems reviewed and are negative.      Objective:   Physical Exam Vitals and nursing note reviewed.  Constitutional:      General: Haley Allen is not in acute distress.    Appearance: Normal appearance. Haley Allen is well-developed.  Neck:     Vascular: No carotid bruit or JVD.   Cardiovascular:     Rate and Rhythm: Normal rate and regular rhythm.     Heart sounds: Normal heart sounds.  Pulmonary:     Effort: Pulmonary effort is normal. No respiratory distress.     Breath sounds: Normal breath sounds. No wheezing or  rales.  Chest:     Chest wall: No tenderness.  Abdominal:     General: Bowel sounds are normal. There is no distension or abdominal bruit.     Palpations: Abdomen is soft. There is no hepatomegaly, splenomegaly, mass or pulsatile mass.     Tenderness: There is no abdominal tenderness.   Musculoskeletal:        General: Normal range of motion.     Cervical back: Normal range of motion and neck supple.  Lymphadenopathy:     Cervical: No cervical adenopathy.   Skin:    General: Skin is warm and dry.   Neurological:     Mental Status: Haley Allen is alert and oriented to person, place, and time.     Deep Tendon Reflexes: Reflexes are normal and symmetric.   Psychiatric:        Behavior: Behavior normal.        Thought Content: Thought content normal.        Judgment: Judgment normal.    BP 109/75   Pulse 93   Temp (!) 97.5 F (36.4 C) (Temporal)   Ht 5' 8 (1.727 m)   Wt 167 lb (75.8 kg)   SpO2 98%   BMI 25.39 kg/m          Assessment & Plan:   [name] in today with chief complaint of weight check  1. BMI 25.0-25.9,adult Low fat diet and  exercise encouraged Needs to take a break form meds after these refills. - phentermine  37.5 MG capsule; Take 1 capsule (37.5 mg total) by mouth every morning.  Dispense: 30 capsule; Refill: 2    The above assessment and management plan was discussed with the patient. The patient verbalized understanding of and has agreed to the management plan. Patient is aware to call the clinic if symptoms persist or worsen. Patient is aware when to return to the clinic for a follow-up visit. Patient educated on when it is appropriate to go to the emergency department.   [name] Gaylyn Keas, FNP

## 2024-03-06 DIAGNOSIS — L578 Other skin changes due to chronic exposure to nonionizing radiation: Secondary | ICD-10-CM | POA: Diagnosis not present

## 2024-03-06 DIAGNOSIS — D225 Melanocytic nevi of trunk: Secondary | ICD-10-CM | POA: Diagnosis not present

## 2024-03-06 DIAGNOSIS — L821 Other seborrheic keratosis: Secondary | ICD-10-CM | POA: Diagnosis not present

## 2024-03-06 DIAGNOSIS — L814 Other melanin hyperpigmentation: Secondary | ICD-10-CM | POA: Diagnosis not present

## 2024-04-26 ENCOUNTER — Ambulatory Visit: Admitting: Nurse Practitioner

## 2024-04-26 VITALS — BP 124/89 | HR 102 | Temp 97.8°F | Ht 68.0 in | Wt 162.0 lb

## 2024-04-26 DIAGNOSIS — Z0000 Encounter for general adult medical examination without abnormal findings: Secondary | ICD-10-CM

## 2024-04-26 DIAGNOSIS — Z87891 Personal history of nicotine dependence: Secondary | ICD-10-CM | POA: Diagnosis not present

## 2024-04-26 DIAGNOSIS — Z0001 Encounter for general adult medical examination with abnormal findings: Secondary | ICD-10-CM

## 2024-04-26 DIAGNOSIS — R3915 Urgency of urination: Secondary | ICD-10-CM

## 2024-04-26 DIAGNOSIS — R600 Localized edema: Secondary | ICD-10-CM

## 2024-04-26 DIAGNOSIS — Z6827 Body mass index (BMI) 27.0-27.9, adult: Secondary | ICD-10-CM

## 2024-04-26 MED ORDER — FUROSEMIDE 20 MG PO TABS: 20.0000 mg | ORAL_TABLET | Freq: Every day | ORAL | 1 refills | Status: AC

## 2024-04-26 MED ORDER — PHENTERMINE HCL 37.5 MG PO CAPS: 37.5000 mg | ORAL_CAPSULE | ORAL | 2 refills | Status: AC

## 2024-04-26 MED ORDER — SOLIFENACIN SUCCINATE 5 MG PO TABS: ORAL_TABLET | ORAL | 1 refills | Status: AC

## 2024-04-26 NOTE — Progress Notes (Signed)
 Subjective:    Patient ID: Haley Allen, female    DOB: 04/08/1981, 43 y.o.   MRN: [id number]   Chief Complaint: medical management of chronic issues      HPI:  Haley Allen is a 43 y.o. who identifies as a female who was assigned female at birth.   Social history: Lives with: husband and son Work history: [employer]   Comes in today for follow up of the following chronic medical issues:  1. Peripheral edema She is on lasix  on an as needed basis. Has frequent edema.  2. Urinary urgency Is on vesicare  daiy and is doing well.  3. Smoker Stop smoking 6 month ago. N  ow she is vaping  4. BMI 27.0-27.9,adult Has been on phentermine  and has lost some weight. Weight is down 5lbs  Wt Readings from Last 3 Encounters:  04/26/24 162 lb (73.5 kg)  12/05/23 167 lb (75.8 kg)  09/08/23 181 lb 6.4 oz (82.3 kg)   BMI Readings from Last 3 Encounters:  04/26/24 24.63 kg/m  12/05/23 25.39 kg/m  09/08/23 27.58 kg/m        New complaints: None today  Allergies  Allergen Reactions   Latex Other (See Comments)    Skin redness   Outpatient Encounter Medications as of 04/26/2024  Medication Sig   cetirizine  (ZYRTEC ) 10 MG tablet Take 1 tablet (10 mg total) by mouth daily.   cyclobenzaprine  (FLEXERIL ) 10 MG tablet Take 1 tablet (10 mg total) by mouth 3 (three) times daily as needed for muscle spasms.   fluticasone  (FLONASE ) 50 MCG/ACT nasal spray Place 2 sprays into both nostrils daily as needed for allergies or rhinitis.   furosemide  (LASIX ) 20 MG tablet Take 1 tablet (20 mg total) by mouth daily.   Multiple Vitamin (MULTIVITAMIN PO) Take by mouth.   phentermine  37.5 MG capsule Take 1 capsule (37.5 mg total) by mouth every morning.   solifenacin  (VESICARE ) 5 MG tablet TAKE 1 TABLET BY MOUTH EVERYDAY AT BEDTIME   No facility-administered encounter medications on file as of 04/26/2024.    Past Surgical History:  Procedure Laterality Date    ANTERIOR CERVICAL DECOMP/DISCECTOMY FUSION N/A 06/12/2021   Procedure: C5-6, C6-7 ANTERIOR CERVICAL DISCECTOMY FUSION, ALLOGRAFT, PLATE;  Surgeon: Barbarann Oneil BROCKS, MD;  Location: [facility] OR;  Service: Orthopedics;  Laterality: N/A;   WISDOM TOOTH EXTRACTION      Family History  Problem Relation Age of Onset   Hypertension Mother    Diabetes Mother    Depression Mother    Cancer Father 79       renal   Renal cancer Father    Diabetes Maternal Grandmother    Leukemia Paternal Grandfather    Prostate cancer Neg Hx    Colon cancer Neg Hx       Controlled substance contract: n/a     Review of Systems  Constitutional:  Negative for diaphoresis.  Eyes:  Negative for pain.  Respiratory:  Negative for shortness of breath.   Cardiovascular:  Negative for chest pain, palpitations and leg swelling.  Gastrointestinal:  Negative for abdominal pain.  Endocrine: Negative for polydipsia.  Skin:  Negative for rash.  Neurological:  Negative for dizziness, weakness and headaches.  Hematological:  Does not bruise/bleed easily.  All other systems reviewed and are negative.      Objective:   Physical Exam Vitals and nursing note reviewed.  Constitutional:      General: She is not in acute distress.  Appearance: Normal appearance. She is well-developed.  HENT:     Head: Normocephalic.     Right Ear: Tympanic membrane normal.     Left Ear: Tympanic membrane normal.     Nose: Nose normal.     Mouth/Throat:     Mouth: Mucous membranes are moist.  Eyes:     Pupils: Pupils are equal, round, and reactive to light.  Neck:     Vascular: No carotid bruit or JVD.  Cardiovascular:     Rate and Rhythm: Normal rate and regular rhythm.     Heart sounds: Normal heart sounds.  Pulmonary:     Effort: Pulmonary effort is normal. No respiratory distress.     Breath sounds: Normal breath sounds. No wheezing or rales.  Chest:     Chest wall: No tenderness.  Abdominal:     General: Bowel sounds are  normal. There is no distension or abdominal bruit.     Palpations: Abdomen is soft. There is no hepatomegaly, splenomegaly, mass or pulsatile mass.     Tenderness: There is no abdominal tenderness.  Musculoskeletal:        General: Normal range of motion.     Cervical back: Normal range of motion and neck supple.  Lymphadenopathy:     Cervical: No cervical adenopathy.  Skin:    General: Skin is warm and dry.  Neurological:     Mental Status: She is alert and oriented to person, place, and time.     Deep Tendon Reflexes: Reflexes are normal and symmetric.  Psychiatric:        Behavior: Behavior normal.        Thought Content: Thought content normal.        Judgment: Judgment normal.     BP 124/89   Pulse (!) 102   Temp 97.8 F (36.6 C) (Temporal)   Ht 5' 8 (1.727 m)   Wt 162 lb (73.5 kg)   SpO2 98%   BMI 24.63 kg/m       Assessment & Plan:   Haley Allen comes in today with chief complaint of annual physical  Diagnosis and orders addressed:  1. Peripheral edema - furosemide  (LASIX ) 20 MG tablet; Take 1 tablet (20 mg total) by mouth daily. (NEEDS TO BE SEEN BEFORE NEXT REFILL)  Dispense: 90 tablet; Refill: 1  2. Urinary urgency Force fluids - solifenacin  (VESICARE ) 5 MG tablet; TAKE 1 TABLET BY MOUTH EVERYDAY AT BEDTIME  Dispense: 90 tablet; Refill: 1  3. Smoker Smoking cessation encouraged  4. BMI 27.0-27.9,adult Discussed diet and exercise for person with BMI >25 Will recheck weight in 3-6 months  - phentermine  37.5 MG capsule; Take 1 capsule (37.5 mg total) by mouth every morning.  Dispense: 30 capsule; Refill: 2   Labs pending Health Maintenance reviewed Diet and exercise encouraged  Follow up plan: 6 months   Haley Gladis, FNP

## 2024-04-26 NOTE — Patient Instructions (Signed)
 Cold Sore    A cold sore, also called a fever blister, is a small, fluid-filled sore that forms inside of the mouth or on the lips, gums, nose, chin, or cheeks. Cold sores can spread to other parts of the body, such as the eyes, fingers, or genitals.  Cold sores can spread from person to person (are contagious) until the sores crust over completely. Most cold sores go away within 2 weeks.  What are the causes?  Cold sores are caused by a virus (herpes simplex virus type 1, HSV-1). The virus can spread from person to person through close contact, such as through:  Kissing.  Touching the affected area.  Sharing personal items such as lip balm, razors, a drinking glass, or eating utensils.  What increases the risk?  Being tired, stressed, or sick.  Having your period (menstruating).  Being pregnant.  Taking certain medicines.  Being out in cold weather or getting too much sun.  What are the signs or symptoms?  Symptoms of a cold sore go through different stages:  Tingling, itching, or burning is felt 1-2 days before the cold sore appears.  Fluid-filled blisters appear on the lips, inside the mouth, on the nose, or on the cheeks.  The blisters start to ooze clear fluid.  The blisters dry up, and a yellow crust appears in their place.  The crust falls off.  In some cases, other symptoms can develop along with cold sores. These can include:  Fever.  Sore throat.  Headache.  Muscle aches.  Swollen neck glands.  How is this treated?  There is no cure for cold sores or the virus that causes them. There is also no vaccine to prevent the virus. Most cold sores go away on their own without treatment within 2 weeks. Your doctor may prescribe medicines to:  Help with pain.  Keep the virus from growing.  Help you heal faster.  Medicines may be in the form of creams, gels, pills, or a shot.  Follow these instructions at home:  Medicines  Take or apply over-the-counter and prescription medicines only as told by your doctor.  Use a  cotton-tip swab to apply creams or gels to your sores.  Ask your doctor if you can take lysine supplements. These may help with healing.  Sore care    Do not touch the sores or pick the scabs.  Wash your hands often with soap and water for at least 20 seconds. Do not touch your eyes without washing your hands first.  Keep the sores clean and dry.  If told, put ice on the sores. To do this:  Put ice in a plastic bag.  Place a towel between your skin and the bag.  Leave the ice on for 20 minutes, 2-3 times a day.  Take off the ice if your skin turns bright red. This is very important. If you cannot feel pain, heat, or cold, you have a greater risk of damage to the area.  Eating and drinking  Eat a soft, bland diet. Avoid eating hot, cold, or salty foods. These can hurt your mouth.  Use a straw if it hurts to drink out of a glass.  Eat foods that have a lot of lysine in them. These include meat, fish, and dairy products.  Avoid sugary foods, chocolates, nuts, and grains. These foods have a high amount of a substance (arginine) that can cause the virus to grow.  Lifestyle  Do not kiss, have oral sex,  or share personal items until your sores heal.  Stress, poor sleep, and being out in the sun can trigger a cold sore. Make sure you:  Do activities that help you relax, such as deep breathing exercises or meditation.  Get enough sleep.  Put sunscreen on your lips before you go out in the sun.  Contact a doctor if:  You have symptoms for more than 2 weeks.  You have pus coming from the sores.  You have redness that is spreading.  You have pain or irritation in your eye.  You get sores on your genitals.  Your sores do not heal within 2 weeks.  You get cold sores often.  Get help right away if:  You have a fever and your symptoms suddenly get worse.  You have a headache and confusion.  You have tiredness (fatigue).  You do not want to eat as much as normal (loss of appetite).  You have a stiff neck or are sensitive to  light.  Summary  A cold sore is a small, fluid-filled sore that forms inside of the mouth or on the lips, gums, nose, chin, or cheeks.  Cold sores can spread from person to person (are contagious) until the sores crust over completely. Most cold sores go away within 2 weeks.  Wash your hands often. Do not touch your eyes without washing your hands first.  Do not kiss, have oral sex, or share personal items until your sores heal.  Contact a doctor if your sores do not heal within 2 weeks.  This information is not intended to replace advice given to you by your health care provider. Make sure you discuss any questions you have with your health care provider.  Document Revised: 03/18/2021 Document Reviewed: 03/18/2021  Elsevier Patient Education  2024 [employer].

## 2024-05-26 ENCOUNTER — Telehealth

## 2024-05-26 DIAGNOSIS — R42 Dizziness and giddiness: Secondary | ICD-10-CM | POA: Diagnosis not present

## 2024-05-26 MED ORDER — MECLIZINE HCL 25 MG PO TABS: 25.0000 mg | ORAL_TABLET | Freq: Two times a day (BID) | ORAL | 0 refills | Status: AC | PRN

## 2024-05-26 NOTE — Patient Instructions (Signed)
 Haley Allen, thank you for joining [name], PA-C for today's virtual visit.  While this provider is not your primary care provider (PCP), if your PCP is located in our provider database this encounter information will be shared with them immediately following your visit.   A [facility] MyChart account gives you access to today's visit and all your visits, tests, and labs performed at [facility]  click here if you don't have a [facility] MyChart account or go to [email or web address]  Consent: (Patient) Haley Allen provided verbal consent for this virtual visit at the beginning of the encounter.  Current Medications:  Current Outpatient Medications:    meclizine  (ANTIVERT ) 25 MG tablet, Take 1 tablet (25 mg total) by mouth 2 (two) times daily as needed for up to 10 days for dizziness., Disp: 20 tablet, Rfl: 0   cetirizine  (ZYRTEC ) 10 MG tablet, Take 1 tablet (10 mg total) by mouth daily., Disp: 90 tablet, Rfl: 1   cyclobenzaprine  (FLEXERIL ) 10 MG tablet, Take 1 tablet (10 mg total) by mouth 3 (three) times daily as needed for muscle spasms., Disp: 30 tablet, Rfl: 1   fluticasone  (FLONASE ) 50 MCG/ACT nasal spray, Place 2 sprays into both nostrils daily as needed for allergies or rhinitis., Disp: 16 g, Rfl: 5   furosemide  (LASIX ) 20 MG tablet, Take 1 tablet (20 mg total) by mouth daily., Disp: 90 tablet, Rfl: 1   Multiple Vitamin (MULTIVITAMIN PO), Take by mouth., Disp: , Rfl:    phentermine  37.5 MG capsule, Take 1 capsule (37.5 mg total) by mouth every morning., Disp: 30 capsule, Rfl: 2   solifenacin  (VESICARE ) 5 MG tablet, TAKE 1 TABLET BY MOUTH EVERYDAY AT BEDTIME, Disp: 90 tablet, Rfl: 1   Medications ordered in this encounter:  Meds ordered this encounter  Medications   meclizine  (ANTIVERT ) 25 MG tablet    Sig: Take 1 tablet (25 mg total) by mouth 2 (two) times daily as needed for up to 10 days for dizziness.    Dispense:  20 tablet    Refill:  0     *If  you need refills on other medications prior to your next appointment, please contact your pharmacy*  Follow-Up: Call back or seek an in-person evaluation if the symptoms worsen or if the condition fails to improve as anticipated.  [facility] [phone]  Other Instructions Vertigo Vertigo is the feeling that you or the things around you are moving or spinning when they're not. It's different than feeling dizzy. It can also cause: Loss of balance. Trouble standing or walking. Nausea and vomiting. This feeling can come and go at any time. It can last from a few seconds to minutes or even hours. It may go away on its own or be treated with medicine. What are the types of vertigo? There are two types of vertigo: Peripheral vertigo happens when parts of your inner ear don't work like they should. This is the more common type. Central vertigo happens when your brain and spinal cord don't work like they should. Your health care provider will do tests to find out what kind of vertigo you have. This will help them decide on the right treatment for you. Follow these instructions at home: Eating and drinking Drink enough fluid to keep your pee (urine) pale yellow. Do not drink alcohol . Activity When you get up in the morning, first sit up on the side of the bed. When you feel okay, stand slowly while holding onto something.  Move slowly. Avoid sudden body or head movements. Avoid certain positions, as told by your provider. Use a cane if you have trouble standing or walking. Sit down right away if you feel unsteady. Place items in your home so they're easy for you to reach without bending or leaning over. Return to normal activities when you're told. Ask what things are safe for you to do. General instructions Take your medicines only as told by your provider. Contact a health care provider if: Your medicines don't help or make your vertigo worse. You get new symptoms. You  have a fever. You have nausea or vomiting. Your family or friends spot any changes in how you're acting. A part of your body goes numb. You feel tingling and prickling in a part of your body. You get very bad headaches. Get help right away if: You're always dizzy or you faint. You have a stiff neck. You have trouble moving or speaking. Your hands, arms, or legs feel weak. Your hearing or eyesight changes. These symptoms may be an emergency. Call 911 right away. Do not wait to see if the symptoms will go away. Do not drive yourself to the hospital. This information is not intended to replace advice given to you by your health care provider. Make sure you discuss any questions you have with your health care provider. Document Revised: 03/10/2023 Document Reviewed: 09/10/2022 Elsevier Patient Education  2024 [employer]   If you have been instructed to have an in-person evaluation today at a local Urgent Care facility, please use the link below. It will take you to a list of all of our available [facility], including address, phone number and hours of operation. Please do not delay care.  [facility]  If you or a family member do not have a primary care provider, use the link below to schedule a visit and establish care. When you choose a [facility] primary care physician or advanced practice provider, you gain a long-term partner in health. Find a Primary Care Provider  Learn more about [facility]'s in-office and virtual care options: [facility] - Get Care Now

## 2024-05-26 NOTE — Progress Notes (Signed)
 Virtual Visit Consent   Haley Allen, you are scheduled for a virtual visit with a [facility] provider today. Just as with appointments in the office, your consent must be obtained to participate. Your consent will be active for this visit and any virtual visit you may have with one of our providers in the next 365 days. If you have a MyChart account, a copy of this consent can be sent to you electronically.  As this is a virtual visit, video technology does not allow for your provider to perform a traditional examination. This may limit your provider's ability to fully assess your condition. If your provider identifies any concerns that need to be evaluated in person or the need to arrange testing (such as labs, EKG, etc.), we will make arrangements to do so. Although advances in technology are sophisticated, we cannot ensure that it will always work on either your end or our end. If the connection with a video visit is poor, the visit may have to be switched to a telephone visit. With either a video or telephone visit, we are not always able to ensure that we have a secure connection.  By engaging in this virtual visit, you consent to the provision of healthcare and authorize for your insurance to be billed (if applicable) for the services provided during this visit. Depending on your insurance coverage, you may receive a charge related to this service.  I need to obtain your verbal consent now. Are you willing to proceed with your visit today? Haley Allen has provided verbal consent on 05/26/2024 for a virtual visit (video or telephone). Haley Allen, [place]  Date: 05/26/2024 10:42 AM   Virtual Visit via Video Note   I, Haley Allen, connected with  Malani Lees  ([id number], 02/11/1981) on 05/26/24 at 10:30 AM EST by a video-enabled telemedicine application and verified that I am speaking with the correct person using two identifiers.  Location: Patient: Virtual Visit Location Patient:  Home Provider: Virtual Visit Location Provider: Home Office   I discussed the limitations of evaluation and management by telemedicine and the availability of in person appointments. The patient expressed understanding and agreed to proceed.    History of Present Illness: Haley Allen is a 43 y.o. who identifies as a female who was assigned female at birth, and is being seen today for c/o waking up several mornings and rolling over and feeling like she was falling.  Pt states yesterday her ears starting to hurt a little, feeling off balance and the room was spinning. Pt state she felt nauseous. Pt states this the first time this ever happened. Pt states if she bends down the dizziness occurs.  Pt states the dizziness is like the whole room is spinning.   HPI: HPI  Problems:  Patient Active Problem List   Diagnosis Date Noted   Peripheral edema 02/25/2022   Seasonal allergic rhinitis due to pollen 02/05/2022   Urinary urgency 02/05/2022   BMI 27.0-27.9,adult 02/05/2022   Gamekeeper's thumb, right 10/15/2021   Cervical spinal stenosis 06/12/2021   History of smoking 06/30/2017    Allergies:  Allergies  Allergen Reactions   Latex Other (See Comments)    Skin redness   Medications:  Current Outpatient Medications:    meclizine  (ANTIVERT ) 25 MG tablet, Take 1 tablet (25 mg total) by mouth 2 (two) times daily as needed for up to 10 days for dizziness., Disp: 20 tablet, Rfl: 0   cetirizine  (ZYRTEC ) 10 MG tablet, Take 1 tablet (10  mg total) by mouth daily., Disp: 90 tablet, Rfl: 1   cyclobenzaprine  (FLEXERIL ) 10 MG tablet, Take 1 tablet (10 mg total) by mouth 3 (three) times daily as needed for muscle spasms., Disp: 30 tablet, Rfl: 1   fluticasone  (FLONASE ) 50 MCG/ACT nasal spray, Place 2 sprays into both nostrils daily as needed for allergies or rhinitis., Disp: 16 g, Rfl: 5   furosemide  (LASIX ) 20 MG tablet, Take 1 tablet (20 mg total) by mouth daily., Disp: 90 tablet, Rfl: 1   Multiple  Vitamin (MULTIVITAMIN PO), Take by mouth., Disp: , Rfl:    phentermine  37.5 MG capsule, Take 1 capsule (37.5 mg total) by mouth every morning., Disp: 30 capsule, Rfl: 2   solifenacin  (VESICARE ) 5 MG tablet, TAKE 1 TABLET BY MOUTH EVERYDAY AT BEDTIME, Disp: 90 tablet, Rfl: 1  Observations/Objective: Patient is well-developed, well-nourished in no acute distress.  Resting comfortably at home.  Head is normocephalic, atraumatic.  No labored breathing.  Speech is clear and coherent with logical content.  Patient is alert and oriented at baseline.    Assessment and Plan: 1. Vertigo (Primary) - meclizine  (ANTIVERT ) 25 MG tablet; Take 1 tablet (25 mg total) by mouth 2 (two) times daily as needed for up to 10 days for dizziness.  Dispense: 20 tablet; Refill: 0  -Discussed vertigo exercises and starting Meclizine  as needed -Pt was advised to follow up with PCP for worsening symptoms.   Follow Up Instructions: I discussed the assessment and treatment plan with the patient. The patient was provided an opportunity to ask questions and all were answered. The patient agreed with the plan and demonstrated an understanding of the instructions.  A copy of instructions were sent to the patient via MyChart unless otherwise noted below.    The patient was advised to call back or seek an in-person evaluation if the symptoms worsen or if the condition fails to improve as anticipated.    Haley Mater, PA-C

## 2024-06-04 DIAGNOSIS — Z1151 Encounter for screening for human papillomavirus (HPV): Secondary | ICD-10-CM | POA: Diagnosis not present

## 2024-06-04 DIAGNOSIS — Z01419 Encounter for gynecological examination (general) (routine) without abnormal findings: Secondary | ICD-10-CM | POA: Diagnosis not present

## 2024-06-08 ENCOUNTER — Encounter: Payer: Self-pay | Admitting: Nurse Practitioner

## 2024-06-08 ENCOUNTER — Telehealth (INDEPENDENT_AMBULATORY_CARE_PROVIDER_SITE_OTHER): Admitting: Nurse Practitioner

## 2024-06-08 DIAGNOSIS — J040 Acute laryngitis: Secondary | ICD-10-CM | POA: Diagnosis not present

## 2024-06-08 DIAGNOSIS — R051 Acute cough: Secondary | ICD-10-CM

## 2024-06-08 MED ORDER — PREDNISONE 20 MG PO TABS: 40.0000 mg | ORAL_TABLET | Freq: Every day | ORAL | 0 refills | Status: AC

## 2024-06-08 NOTE — Patient Instructions (Signed)
 1. Take meds as prescribed 2. Use a cool mist humidifier especially during the winter months and when heat has been humid. 3. Use saline nose sprays frequently 4. Saline irrigations of the nose can be very helpful if done frequently.  * 4X daily for 1 week*  * Use of a nettie pot can be helpful with this. Follow directions with this* 5. Drink plenty of fluids 6. Keep thermostat turn down low 7.For any cough or congestion- delsym or mucinex 8. For fever or aces or pains- take tylenol or ibuprofen appropriate for age and weight.  * for fevers greater than 101 orally you may alternate ibuprofen and tylenol every  3 hours.

## 2024-06-08 NOTE — Progress Notes (Signed)
 "   Virtual Visit Consent   Haley Allen, you are scheduled for a virtual visit with Haley Gladis, FNP, a [facility] provider, today.     Just as with appointments in the office, your consent must be obtained to participate.  Your consent will be active for this visit and any virtual visit you may have with one of our providers in the next 365 days.     If you have a MyChart account, a copy of this consent can be sent to you electronically.  All virtual visits are billed to your insurance company just like a traditional visit in the office.    As this is a virtual visit, video technology does not allow for your provider to perform a traditional examination.  This may limit your provider's ability to fully assess your condition.  If your provider identifies any concerns that need to be evaluated in person or the need to arrange testing (such as labs, EKG, etc.), we will make arrangements to do so.     Although advances in technology are sophisticated, we cannot ensure that it will always work on either your end or our end.  If the connection with a video visit is poor, the visit may have to be switched to a telephone visit.  With either a video or telephone visit, we are not always able to ensure that we have a secure connection.     I need to obtain your verbal consent now.   Are you willing to proceed with your visit today? YES   Ayline Dingus has provided verbal consent on 06/08/2024 for a virtual visit (video or telephone).   Haley Gladis, FNP   Date: 06/08/2024 10:05 AM   Virtual Visit via Video Note   I, Haley Allen, connected with Haley Allen ([id number], 10/31/1980) on 06/08/2024 at 10:45 AM EST by a video-enabled telemedicine application and verified that I am speaking with the correct person using two identifiers.  Location: Patient: Virtual Visit Location Patient: Home Provider: Virtual Visit Location Provider: Mobile   I discussed the  limitations of evaluation and management by telemedicine and the availability of in person appointments. The patient expressed understanding and agreed to proceed.    History of Present Illness: Haley Allen is a 43 y.o. who identifies as a female who was assigned female at birth, and is being seen today for uri.  HPI: URI  This is a new problem. The current episode started yesterday. The problem has been waxing and waning. There has been no fever. Associated symptoms include coughing and a sore throat. She has tried decongestant for the symptoms. The treatment provided mild relief.    Review of Systems  HENT:  Positive for sore throat.   Respiratory:  Positive for cough.     Problems:  Patient Active Problem List   Diagnosis Date Noted   Peripheral edema 02/25/2022   Seasonal allergic rhinitis due to pollen 02/05/2022   Urinary urgency 02/05/2022   BMI 27.0-27.9,adult 02/05/2022   Gamekeeper's thumb, right 10/15/2021   Cervical spinal stenosis 06/12/2021   History of smoking 06/30/2017    Allergies: Allergies[1] Medications: Current Medications[2]  Observations/Objective: Patient is well-developed, well-nourished in no acute distress.  Resting comfortably  at home.  Head is normocephalic, atraumatic.  No labored breathing.  Speech is clear and coherent with logical content.  Patient is alert and oriented at baseline.  Voice very faint Dry cough  Assessment and Plan:  Haley Allen in today with chief complaint  of No chief complaint on file.   1. Laryngitis (Primary)  2. Acute cough 1. Take meds as prescribed 2. Use a cool mist humidifier especially during the winter months and when heat has been humid. 3. Use saline nose sprays frequently 4. Saline irrigations of the nose can be very helpful if done frequently.  * 4X daily for 1 week*  * Use of a nettie pot can be helpful with this. Follow directions with this* 5. Drink plenty of fluids 6. Keep thermostat turn  down low 7.For any cough or congestion- delsym or mucinex if needed 8. For fever or aces or pains- take tylenol  or ibuprofen appropriate for age and weight.  * for fevers greater than 101 orally you may alternate ibuprofen and tylenol  every  3 hours.    Meds ordered this encounter  Medications   predniSONE  (DELTASONE ) 20 MG tablet    Sig: Take 2 tablets (40 mg total) by mouth daily with breakfast for 5 days. 2 po daily for 5 days    Dispense:  10 tablet    Refill:  0    Supervising Provider:   MARYANNE CHEW A [[id number]]      Follow Up Instructions: I discussed the assessment and treatment plan with the patient. The patient was provided an opportunity to ask questions and all were answered. The patient agreed with the plan and demonstrated an understanding of the instructions.  A copy of instructions were sent to the patient via MyChart.  The patient was advised to call back or seek an in-person evaluation if the symptoms worsen or if the condition fails to improve as anticipated.  Time:  I spent 8 minutes with the patient via telehealth technology discussing the above problems/concerns.    Haley Gladis, FNP    [1]  Allergies Allergen Reactions   Latex Other (See Comments)    Skin redness  [2]  Current Outpatient Medications:    cetirizine  (ZYRTEC ) 10 MG tablet, Take 1 tablet (10 mg total) by mouth daily., Disp: 90 tablet, Rfl: 1   cyclobenzaprine  (FLEXERIL ) 10 MG tablet, Take 1 tablet (10 mg total) by mouth 3 (three) times daily as needed for muscle spasms., Disp: 30 tablet, Rfl: 1   fluticasone  (FLONASE ) 50 MCG/ACT nasal spray, Place 2 sprays into both nostrils daily as needed for allergies or rhinitis., Disp: 16 g, Rfl: 5   furosemide  (LASIX ) 20 MG tablet, Take 1 tablet (20 mg total) by mouth daily., Disp: 90 tablet, Rfl: 1   Multiple Vitamin (MULTIVITAMIN PO), Take by mouth., Disp: , Rfl:    phentermine  37.5 MG capsule, Take 1 capsule (37.5 mg total) by mouth  every morning., Disp: 30 capsule, Rfl: 2   solifenacin  (VESICARE ) 5 MG tablet, TAKE 1 TABLET BY MOUTH EVERYDAY AT BEDTIME, Disp: 90 tablet, Rfl: 1  "

## 2024-06-27 ENCOUNTER — Encounter (HOSPITAL_BASED_OUTPATIENT_CLINIC_OR_DEPARTMENT_OTHER): Payer: Self-pay | Admitting: Pharmacist

## 2024-06-27 ENCOUNTER — Other Ambulatory Visit (HOSPITAL_BASED_OUTPATIENT_CLINIC_OR_DEPARTMENT_OTHER): Payer: Self-pay

## 2024-06-27 MED ORDER — MIRENA (52 MG) 20 MCG/DAY IU IUD
INTRAUTERINE_SYSTEM | INTRAUTERINE | 0 refills | Status: DC
  Filled 2024-06-27: qty 1, 90d supply, fill #0
  Filled 2024-06-27: qty 1, 30d supply, fill #0

## 2024-08-16 ENCOUNTER — Ambulatory Visit
